# Patient Record
Sex: Male | Born: 1995 | Race: White | Hispanic: No | Marital: Single | Smoking: Never smoker
Health system: Southern US, Community
[De-identification: ages and names within clinical notes are randomized; demographics above are authoritative.]

## PROBLEM LIST (undated history)

## (undated) DIAGNOSIS — R112 Nausea with vomiting, unspecified: Secondary | ICD-10-CM

## (undated) DIAGNOSIS — E669 Obesity, unspecified: Secondary | ICD-10-CM

## (undated) DIAGNOSIS — J302 Other seasonal allergic rhinitis: Secondary | ICD-10-CM

## (undated) DIAGNOSIS — Z9889 Other specified postprocedural states: Secondary | ICD-10-CM

## (undated) DIAGNOSIS — I10 Essential (primary) hypertension: Secondary | ICD-10-CM

## (undated) HISTORY — DX: Other seasonal allergic rhinitis: J30.2

## (undated) HISTORY — DX: Obesity, unspecified: E66.9

## (undated) HISTORY — DX: Essential (primary) hypertension: I10

## (undated) HISTORY — PX: APPENDECTOMY: SHX54

---
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
## 2015-05-13 ENCOUNTER — Encounter: Payer: Self-pay | Admitting: Family Medicine

## 2015-05-13 ENCOUNTER — Ambulatory Visit (INDEPENDENT_AMBULATORY_CARE_PROVIDER_SITE_OTHER): Payer: BLUE CROSS/BLUE SHIELD | Admitting: Family Medicine

## 2015-05-13 VITALS — BP 138/92 | Temp 98.9°F | Ht 76.0 in | Wt 385.0 lb

## 2015-05-13 DIAGNOSIS — J351 Hypertrophy of tonsils: Secondary | ICD-10-CM

## 2015-05-13 DIAGNOSIS — J019 Acute sinusitis, unspecified: Secondary | ICD-10-CM | POA: Diagnosis not present

## 2015-05-13 DIAGNOSIS — B9689 Other specified bacterial agents as the cause of diseases classified elsewhere: Secondary | ICD-10-CM

## 2015-05-13 DIAGNOSIS — E669 Obesity, unspecified: Secondary | ICD-10-CM | POA: Diagnosis not present

## 2015-05-13 MED ORDER — AMOXICILLIN 500 MG PO TABS: 500.0000 mg | ORAL_TABLET | Freq: Three times a day (TID) | ORAL | Status: DC

## 2015-05-13 NOTE — Progress Notes (Signed)
   Subjective:    Patient ID: Vincent Solis, male    DOB: 06-24-1995, 20 y.o.   MRN: [id number]  Cough This is a new problem. Episode onset: one and a half weeks. Associated symptoms include nasal congestion, rhinorrhea and a sore throat. Pertinent negatives include no chest pain, ear pain, fever or wheezing. Treatments tried: mucinex, allergy med.    Progressive head congestion sinus pressure drainage and coughing over the past week and half worse over the past few days denies high fever chills or sweats He is curing toward having gastric bypass surgery somewhere within the next 60 days  Review of Systems  Constitutional: Negative for fever and activity change.  HENT: Positive for congestion, rhinorrhea and sore throat. Negative for ear pain.   Eyes: Negative for discharge.  Respiratory: Positive for cough. Negative for wheezing.   Cardiovascular: Negative for chest pain.       Objective:   Physical Exam  Constitutional: He appears well-developed.  HENT:  Head: Normocephalic.  Mouth/Throat: Oropharynx is clear and moist. No oropharyngeal exudate.  Neck: Normal range of motion.  Cardiovascular: Normal rate, regular rhythm and normal heart sounds.   No murmur heard. Pulmonary/Chest: Effort normal and breath sounds normal. He has no wheezes.  Lymphadenopathy:    He has no cervical adenopathy.  Neurological: He exhibits normal muscle tone.  Skin: Skin is warm and dry.  Nursing note and vitals reviewed.         Assessment & Plan:  Patient was seen today for upper respiratory illness. It is felt that the patient is dealing with sinusitis. Antibiotics were prescribed today. Importance of compliance with medication was discussed. Symptoms should gradually resolve over the course of the next several days. If high fevers, progressive illness, difficulty breathing, worsening condition or failure for symptoms to improve over the next several days then the patient is to follow-up. If any  emergent conditions the patient is to follow-up in the emergency department otherwise to follow-up in the office.   Mild tonsillar hypertrophy if this persists consider removal especially with snoring issues Morbid obesity getting gastric starches a good idea

## 2015-05-27 ENCOUNTER — Telehealth: Payer: Self-pay | Admitting: Family Medicine

## 2015-05-27 NOTE — Telephone Encounter (Signed)
In your yellow folder in your office is the note telling you what Enis needs  To get his gastric bypass. Letter of medical necessity    Father spoke with you about this at his own appt

## 2015-05-29 ENCOUNTER — Ambulatory Visit (INDEPENDENT_AMBULATORY_CARE_PROVIDER_SITE_OTHER): Payer: BLUE CROSS/BLUE SHIELD | Admitting: Family Medicine

## 2015-05-29 ENCOUNTER — Other Ambulatory Visit (HOSPITAL_COMMUNITY): Payer: Self-pay | Admitting: Surgery

## 2015-05-29 ENCOUNTER — Encounter: Payer: Self-pay | Admitting: Family Medicine

## 2015-05-29 VITALS — BP 130/86 | Temp 98.6°F | Ht 76.0 in | Wt 383.5 lb

## 2015-05-29 DIAGNOSIS — J019 Acute sinusitis, unspecified: Secondary | ICD-10-CM | POA: Diagnosis not present

## 2015-05-29 DIAGNOSIS — E669 Obesity, unspecified: Secondary | ICD-10-CM | POA: Diagnosis not present

## 2015-05-29 DIAGNOSIS — B9689 Other specified bacterial agents as the cause of diseases classified elsewhere: Secondary | ICD-10-CM

## 2015-05-29 MED ORDER — AMOXICILLIN-POT CLAVULANATE 875-125 MG PO TABS
1.0000 | ORAL_TABLET | Freq: Two times a day (BID) | ORAL | Status: DC
Start: 2015-05-29 — End: 2015-06-16

## 2015-05-29 NOTE — Progress Notes (Signed)
   Subjective:    Patient ID: Vincent Solis, male    DOB: [birth year], 20 y.o.   MRN: [id number]  Cough This is a new problem. The current episode started 1 to 4 weeks ago. The problem occurs every few minutes. The cough is non-productive. Associated symptoms include rhinorrhea. Pertinent negatives include no chest pain, ear pain, fever or wheezing. Associated symptoms comments: Congestion . Treatments tried: Amoxicillin.   Patient states no other concerns this visit.   Review of Systems  Constitutional: Negative for fever and activity change.  HENT: Positive for congestion and rhinorrhea. Negative for ear pain.   Eyes: Negative for discharge.  Respiratory: Positive for cough. Negative for wheezing.   Cardiovascular: Negative for chest pain.       Objective:   Physical Exam  Constitutional: He appears well-developed.  HENT:  Head: Normocephalic.  Mouth/Throat: Oropharynx is clear and moist. No oropharyngeal exudate.  Neck: Normal range of motion.  Cardiovascular: Normal rate, regular rhythm and normal heart sounds.   No murmur heard. Pulmonary/Chest: Effort normal and breath sounds normal. He has no wheezes.  Lymphadenopathy:    He has no cervical adenopathy.  Neurological: He exhibits normal muscle tone.  Skin: Skin is warm and dry.  Nursing note and vitals reviewed.         Assessment & Plan:  Persistent upper respiratory illness probable mild sinusitis and additional round of antibiotics prescribed if problems with the medicine notify us otherwise follow-up if any issues down the road warning signs discussed. Patient was seen today for upper respiratory illness. It is felt that the patient is dealing with sinusitis. Antibiotics were prescribed today. Importance of compliance with medication was discussed. Symptoms should gradually resolve over the course of the next several days. If high fevers, progressive illness, difficulty breathing, worsening condition or failure for  symptoms to improve over the next several days then the patient is to follow-up. If any emergent conditions the patient is to follow-up in the emergency department otherwise to follow-up in the office.

## 2015-06-02 ENCOUNTER — Ambulatory Visit (HOSPITAL_BASED_OUTPATIENT_CLINIC_OR_DEPARTMENT_OTHER): Payer: BLUE CROSS/BLUE SHIELD | Attending: Surgery | Admitting: Radiology

## 2015-06-02 VITALS — Ht 76.0 in | Wt 385.0 lb

## 2015-06-02 DIAGNOSIS — G4733 Obstructive sleep apnea (adult) (pediatric): Secondary | ICD-10-CM | POA: Insufficient documentation

## 2015-06-02 DIAGNOSIS — G4736 Sleep related hypoventilation in conditions classified elsewhere: Secondary | ICD-10-CM | POA: Insufficient documentation

## 2015-06-02 DIAGNOSIS — Z6841 Body Mass Index (BMI) 40.0 and over, adult: Secondary | ICD-10-CM | POA: Insufficient documentation

## 2015-06-02 DIAGNOSIS — E669 Obesity, unspecified: Secondary | ICD-10-CM | POA: Insufficient documentation

## 2015-06-02 DIAGNOSIS — G4700 Insomnia, unspecified: Secondary | ICD-10-CM | POA: Diagnosis not present

## 2015-06-02 DIAGNOSIS — G4737 Central sleep apnea in conditions classified elsewhere: Secondary | ICD-10-CM | POA: Diagnosis not present

## 2015-06-02 DIAGNOSIS — G4719 Other hypersomnia: Secondary | ICD-10-CM | POA: Insufficient documentation

## 2015-06-02 DIAGNOSIS — R0683 Snoring: Secondary | ICD-10-CM | POA: Insufficient documentation

## 2015-06-09 NOTE — Telephone Encounter (Signed)
Father notified and transferred to front desk to schedule appointment.

## 2015-06-09 NOTE — Telephone Encounter (Signed)
Contact father, had wanted [place] to do a letter documenting mneed for bariatric interventionwe have not sen this pt for a full exam in several yrs in order to write a strong letter to back up need for bariatric procedure would rec wellness exam and evaluation and discusiion then we can do letter

## 2015-06-13 NOTE — Progress Notes (Signed)
   Patient Name: Vincent Solis, Vincent Solis Date: 06/02/2015 Gender: Male D.O.B: [birth year] Age (years): 19 Referring Provider: Luretha Murphy Height (inches): 76 Interpreting Physician: Jetty Duhamel MD, ABSM Weight (lbs): 385 RPSGT: Ulyess Mort BMI: 47 MRN: [id number] Neck Size: 21.00  CLINICAL INFORMATION Sleep Study Type: NPSG Indication for sleep study: Excessive Daytime Sleepiness, Obesity, Snoring Epworth Sleepiness Score: 4  SLEEP STUDY TECHNIQUE As per the AASM Manual for the Scoring of Sleep and Associated Events v2.3 (April 2016) with a hypopnea requiring 4% desaturations. The channels recorded and monitored were frontal, central and occipital EEG, electrooculogram (EOG), submentalis EMG (chin), nasal and oral airflow, thoracic and abdominal wall motion, anterior tibialis EMG, snore microphone, electrocardiogram, and pulse oximetry.  MEDICATIONS Patient's medications include: charted fpr review. Medications self-administered by patient during sleep study : No sleep medicine administered.  SLEEP ARCHITECTURE The study was initiated at 9:59:28 PM and ended at 4:18:25 AM. Sleep onset time was 14.3 minutes and the sleep efficiency was 42.7%. The total sleep time was 162.0 minutes. Stage REM latency was N/A minutes. The patient spent 46.91% of the night in stage N1 sleep, 41.67% in stage N2 sleep, 11.42% in stage N3 and 0.00% in REM. Alpha intrusion was absent. Supine sleep was 87.04%. Wake after sleep onset 202.6 minutes with fragmentation and frequent arousal and awakenings  RESPIRATORY PARAMETERS The overall apnea/hypopnea index (AHI) was 53.7 per hour. There were 49 total apneas, including 4 obstructive, 43 central and 2 mixed apneas. There were 96 hypopneas and 49 RERAs. The AHI during Stage REM sleep was N/A per hour. AHI while supine was 61.7 per hour. The mean oxygen saturation was 91.79%. The minimum SpO2 during sleep was 79.00%. Moderate snoring was noted during  this study.  CARDIAC DATA The 2 lead EKG demonstrated sinus rhythm. The mean heart rate was 59.22 beats per minute. Other EKG findings include: None.  LEG MOVEMENT DATA The total PLMS were 15 with a resulting PLMS index of 5.56. Associated arousal with leg movement index was 1.9 .  IMPRESSIONS - Severe obstructive sleep apnea occurred during this study (AHI = 53.7/h). - Significant difficulty initiating andf maintaining sleep. - Insufficient sleep and early events to meet protocol requirements for split CPAP titiration - Mild central sleep apnea occurred during this study (CAI = 15.9/h). - Moderate oxygen desaturation was noted during this study (Min O2 = 79.00%). - The patient snored with Moderate snoring volume. - No cardiac abnormalities were noted during this study. - Mild periodic limb movements of sleep occurred during the study. No significant associated arousals.  DIAGNOSIS - Obstructive Sleep Apnea (327.23 [G47.33 ICD-10]) - Central Sleep Apnea (327.27 [G47.37 ICD-10]) - Nocturnal Hypoxemia (327.26 [G47.36 ICD-10]) - Difficulty initiating and maintaining sleep- insomnia on this study night  RECOMMENDATIONS - CPAP titration to determine optimal pressure required to alleviate sleep disordered breathing. - Sleep medication may help with anemia, especially during initial CPAP at home.  - Positional therapy avoiding supine position during sleep. - Avoid alcohol, sedatives and other CNS depressants that may worsen sleep apnea and disrupt normal sleep architecture. - Sleep hygiene should be reviewed to assess factors that may improve sleep quality. - Weight management and regular exercise should be initiated or continued if appropriate.  Waymon Budge Diplomate, American Board of Sleep Medicine  ELECTRONICALLY SIGNED ON:  06/13/2015, 9:50 AM [facility] PH: [phone]   FX: [phone] ACCREDITED BY THE AMERICAN ACADEMY OF SLEEP MEDICINE

## 2015-06-16 ENCOUNTER — Ambulatory Visit (INDEPENDENT_AMBULATORY_CARE_PROVIDER_SITE_OTHER): Payer: BLUE CROSS/BLUE SHIELD | Admitting: Family Medicine

## 2015-06-16 ENCOUNTER — Encounter: Payer: Self-pay | Admitting: Family Medicine

## 2015-06-16 VITALS — BP 130/80 | Ht 76.0 in | Wt 390.4 lb

## 2015-06-16 DIAGNOSIS — I10 Essential (primary) hypertension: Secondary | ICD-10-CM | POA: Diagnosis not present

## 2015-06-16 DIAGNOSIS — Z23 Encounter for immunization: Secondary | ICD-10-CM | POA: Diagnosis not present

## 2015-06-16 DIAGNOSIS — Z0000 Encounter for general adult medical examination without abnormal findings: Secondary | ICD-10-CM | POA: Diagnosis not present

## 2015-06-16 NOTE — Progress Notes (Signed)
   Subjective:    Patient ID: Vincent Solis, male    DOB: 1996/02/13, 20 y.o.   MRN: [id number]  HPI The patient comes in today for a wellness visit.    A review of their health history was completed.  A review of medications was also completed.  Any needed refills; no  Eating habits: working on healthy eating-sees dietician Friday  Falls/  MVA accidents in past few months: no  Regular exercise: walking and work  Barrister's clerk pt sees on regular basis: preparing for gastric sleeve procedure the end of feb  Preventative health issues were discussed.   Additional concerns: none  Participated in [place] craig a couple times, weight watchrs a couple times, lost weight with but always regained  Some tendency to gain weight on mom's side and slow metabolism  BP meds  Review of Systems  Constitutional: Negative for fever, activity change and appetite change.  HENT: Negative for congestion and rhinorrhea.   Eyes: Negative for discharge.  Respiratory: Negative for cough and wheezing.   Cardiovascular: Negative for chest pain.  Gastrointestinal: Negative for vomiting, abdominal pain and blood in stool.  Genitourinary: Negative for frequency and difficulty urinating.  Musculoskeletal: Negative for neck pain.  Skin: Negative for rash.  Allergic/Immunologic: Negative for environmental allergies and food allergies.  Neurological: Negative for weakness and headaches.  Psychiatric/Behavioral: Negative for agitation.  All other systems reviewed and are negative.      Objective:   Physical Exam  Constitutional: He appears well-developed and well-nourished.  Morbid obesity present BMI greater than 50  HENT:  Head: Normocephalic and atraumatic.  Right Ear: External ear normal.  Left Ear: External ear normal.  Nose: Nose normal.  Mouth/Throat: Oropharynx is clear and moist.  Eyes: EOM are normal. Pupils are equal, round, and reactive to light.  Neck: Normal range of motion. Neck  supple. No thyromegaly present.  Cardiovascular: Normal rate, regular rhythm and normal heart sounds.   No murmur heard. Pulmonary/Chest: Effort normal and breath sounds normal. No respiratory distress. He has no wheezes.  Abdominal: Soft. Bowel sounds are normal. He exhibits no distension and no mass. There is no tenderness.  Genitourinary: Penis normal.  Musculoskeletal: Normal range of motion. He exhibits no edema.  Lymphadenopathy:    He has no cervical adenopathy.  Neurological: He is alert. He exhibits normal muscle tone.  Skin: Skin is warm and dry. No erythema.  Psychiatric: He has a normal mood and affect. His behavior is normal. Judgment normal.  Vitals reviewed.         Assessment & Plan:  Impression wellness exam #2 morbid obesity discussed at great length. Patient is been through multiple attempts at weight loss to solid tries with weight watchers. 2 solid tries with [name]. Another area check weight loss clinic. All have eventually failed. Patient continues to gain weight. Now weighs more than he ever has. Blood work in the past is revealed insulin resistance. He notes substantial dyspnea with exertion. Starting to have joint pain knees hips after exercise patient is looking towards bariatric intervention and I agree 110% rationale discussed #3 essential hypertension no need for meds at this time plan we will do N support we'll fill out forms etc. no need to do blood work now only a month prior to procedure. Of note patient also has WSL

## 2015-06-19 ENCOUNTER — Encounter: Payer: Self-pay | Admitting: Dietician

## 2015-06-19 ENCOUNTER — Encounter: Payer: BLUE CROSS/BLUE SHIELD | Attending: Surgery | Admitting: Dietician

## 2015-06-19 DIAGNOSIS — Z6841 Body Mass Index (BMI) 40.0 and over, adult: Secondary | ICD-10-CM | POA: Insufficient documentation

## 2015-06-19 NOTE — Patient Instructions (Signed)
Follow Pre-Op Goals Try Protein Shakes Call [facility] at [phone] when surgery is scheduled to enroll in Pre-Op Class  Things to remember:  Please always be honest with us. We want to support you!  If you have any questions or concerns in between appointments, please call or email Liz, Lincon Sahlin, or Laurie.  The diet after surgery will be high protein and low in carbohydrate.  Vitamins and calcium need to be taken for the rest of your life.  Feel free to include support people in any classes or appointments.   Supplement recommendations:  Complete" Multivitamin: Sleeve Gastrectomy and RYGB patients take a double dose of MVI. LAGB patients take single dose as it is written on the package. Vitamin must be liquid or chewable but not gummy. Examples of these include Flintstones Complete and Centrum Complete. If the vitamin is bariatric-specific, take 1 dose as it is already formulated for bariatric surgery patients. Examples of these are Bariatric Advantage, Celebrate, and Wellesse. These can be found at the [facility] and/or online.     Calcium citrate: 1500 mg/day of Calcium citrate (also chewable or liquid) is recommended for all procedures. The body is only able to absorb 500-600 mg of Calcium at one time so 3 daily doses of 500 mg are recommended. Calcium doses must be taken a minimum of 2 hours apart. Additionally, Calcium must be taken 2 hours apart from iron-containing MVI. Examples of brands include Celebrate, Bariatric Advantage, and Wellesse. These brands must be purchased online or at the [facility]. Citracal Petites is the only Calcium citrate supplement found in general grocery stores and pharmacies. This is in tablet form and may be recommended for patients who do not tolerate chewable Calcium.  Continued or added Vitamin D supplementation based on individual needs.    Vitamin B12: 300-500 mcg/day for Sleeve Gastrectomy and RYGB. Optional for  LAGB patients as stomach remains fully intact. Must be taken intramuscularly, sublingually, or inhaled nasally. Oral route is not recommended. 

## 2015-06-19 NOTE — Progress Notes (Signed)
  Pre-Op Assessment Visit:  Pre-Operative Sleeve gastrectomy Surgery  Medical Nutrition Therapy:  Appt start time: 1000   End time:  1030.  Patient was seen on 06/19/2015 for Pre-Operative Nutrition Assessment. Assessment and letter of approval faxed to [facility] Bariatric Surgery Program coordinator on 06/19/2015.   Preferred Learning Style:   No preference indicated   Learning Readiness:   Ready  Handouts given during visit include:  Pre-Op Goals Bariatric Surgery Protein Shakes   During the appointment today the following Pre-Op Goals were reviewed with the patient: Maintain or lose weight as instructed by your surgeon Make healthy food choices Begin to limit portion sizes Limited concentrated sugars and fried foods Keep fat/sugar in the single digits per serving on   food labels Practice CHEWING your food  (aim for 30 chews per bite or until applesauce consistency) Practice not drinking 15 minutes before, during, and 30 minutes after each meal/snack Avoid all carbonated beverages  Avoid/limit caffeinated beverages  Avoid all sugar-sweetened beverages Consume 3 meals per day; eat every 3-5 hours Make a list of non-food related activities Aim for 64-100 ounces of FLUID daily  Aim for at least 60-80 grams of PROTEIN daily Look for a liquid protein source that contain ?15 g protein and ?5 g carbohydrate  (ex: shakes, drinks, shots)  Demonstrated degree of understanding via:  Teach Back  Teaching Method Utilized:  Visual Auditory Hands on  Barriers to learning/adherence to lifestyle change: none  [facility] to call the [facility] to enroll in Pre-Op and Post-Op Nutrition Education when surgery date is scheduled.

## 2015-06-25 ENCOUNTER — Other Ambulatory Visit: Payer: Self-pay

## 2015-06-25 ENCOUNTER — Ambulatory Visit (HOSPITAL_COMMUNITY)
Admission: RE | Admit: 2015-06-25 | Discharge: 2015-06-25 | Disposition: A | Discharge status: Home / Self Care | Payer: BLUE CROSS/BLUE SHIELD | Source: Ambulatory Visit | Attending: Surgery | Admitting: Surgery

## 2015-06-25 DIAGNOSIS — Z01810 Encounter for preprocedural cardiovascular examination: Secondary | ICD-10-CM | POA: Diagnosis not present

## 2015-06-25 DIAGNOSIS — Z01818 Encounter for other preprocedural examination: Secondary | ICD-10-CM | POA: Diagnosis not present

## 2015-06-25 DIAGNOSIS — K224 Dyskinesia of esophagus: Secondary | ICD-10-CM | POA: Diagnosis not present

## 2015-06-25 IMAGING — CR DG UGI W/ KUB
14 of 16 series · 14 of 17 positions shown · non-contrast
Comparison: 01/13/2005 CT abdomen/ pelvis.

CLINICAL DATA: 19-year-old male presents for preoperative
evaluation for gastric sleeve surgery

EXAM:
UPPER GI SERIES WITH KUB
TECHNIQUE: After obtaining a scout radiograph a routine upper GI series was
performed using thin barium.
FLUOROSCOPY TIME:  Fluoroscopy Time (in minutes and seconds): 2
minutes 0 seconds.
Number of Acquired Images:  3.

[t abdomen supine]
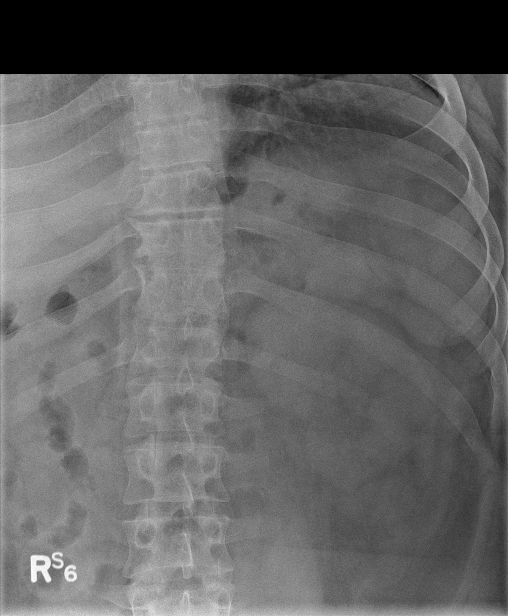

[fluoro_barium 2fps_bw (1 of 4)]
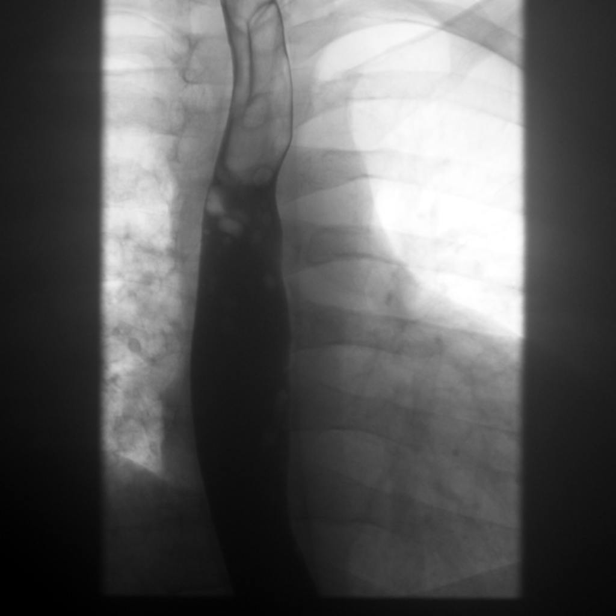

[fluoro_barium 2fps_bw (2 of 4)]
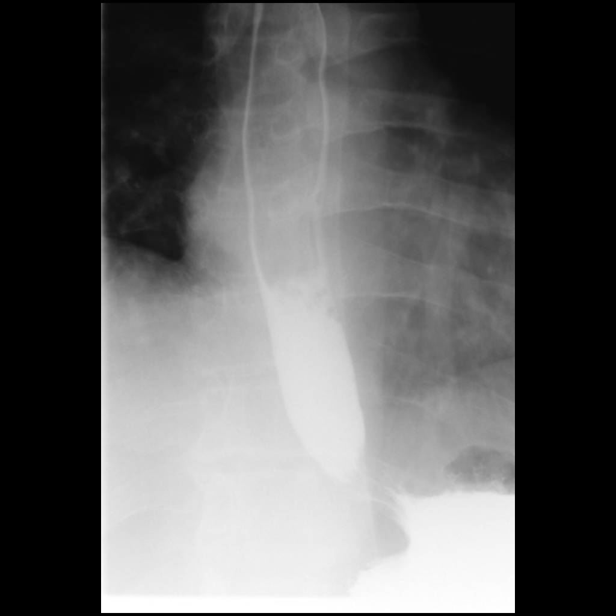

[fluoro_barium 2fps_bw (3 of 4)]
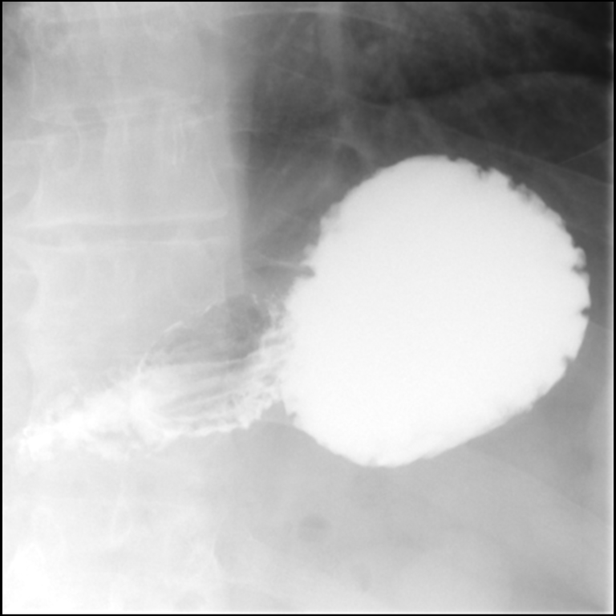

[fluoro_barium 2fps_bw (4 of 4)]
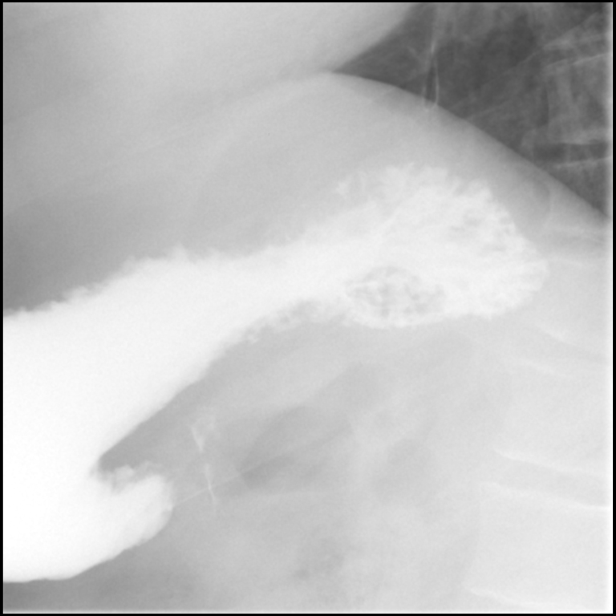

[cp_standard (1 of 9)]
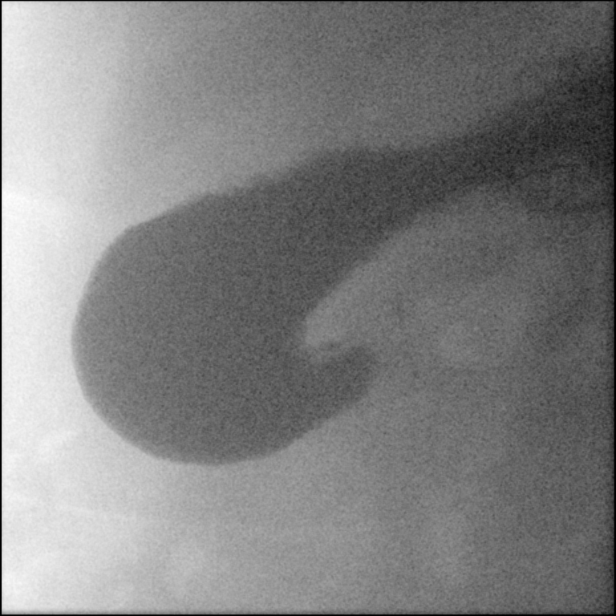

[cp_standard (2 of 9)]
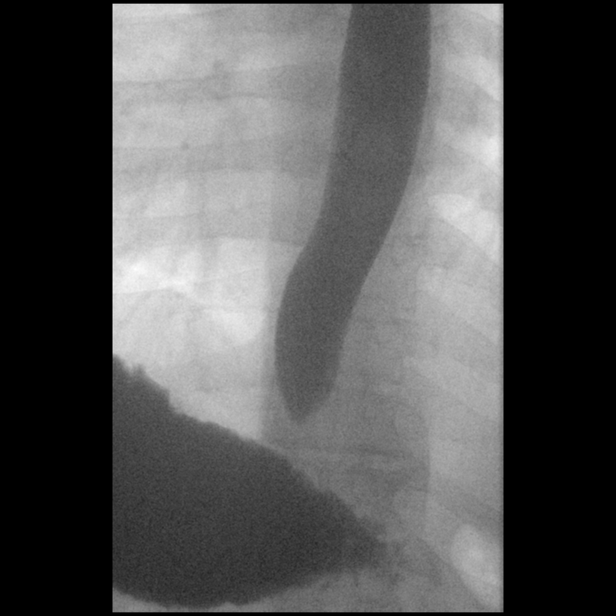

[cp_standard (3 of 9)]
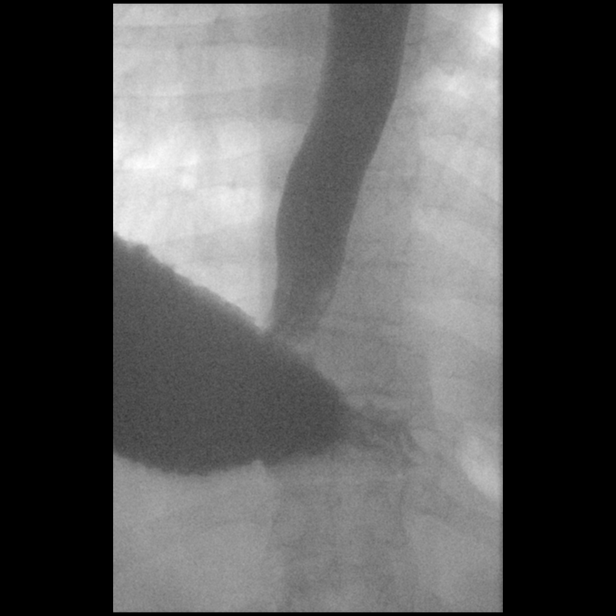

[cp_standard (4 of 9)]
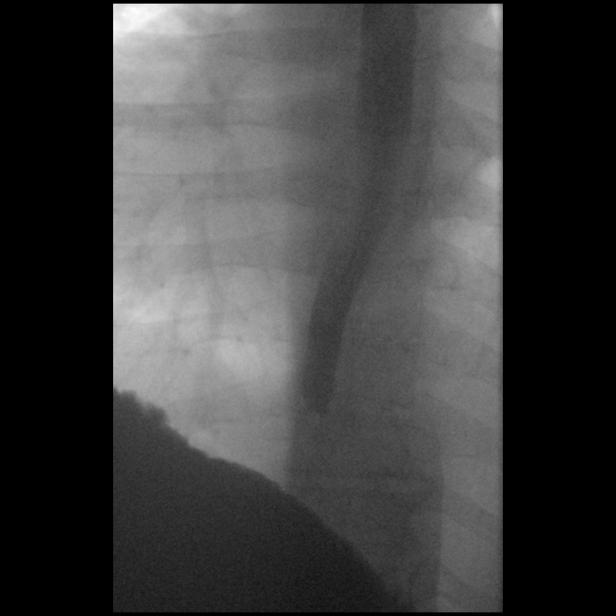

[cp_standard (5 of 9)]
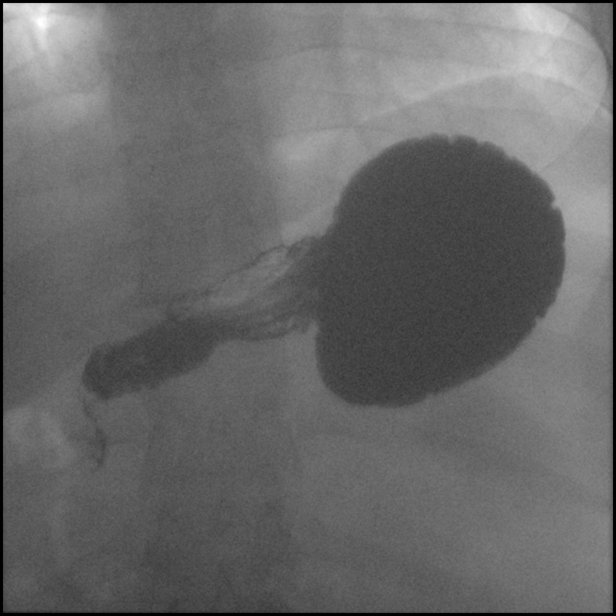

[cp_standard (6 of 9)]
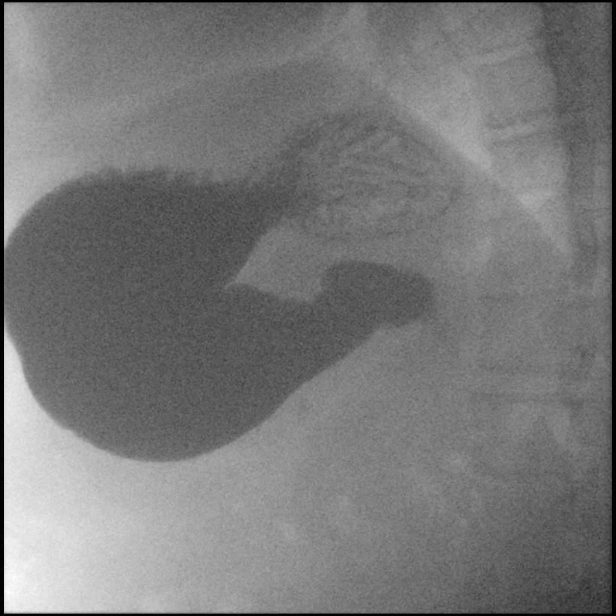

[cp_standard (7 of 9)]
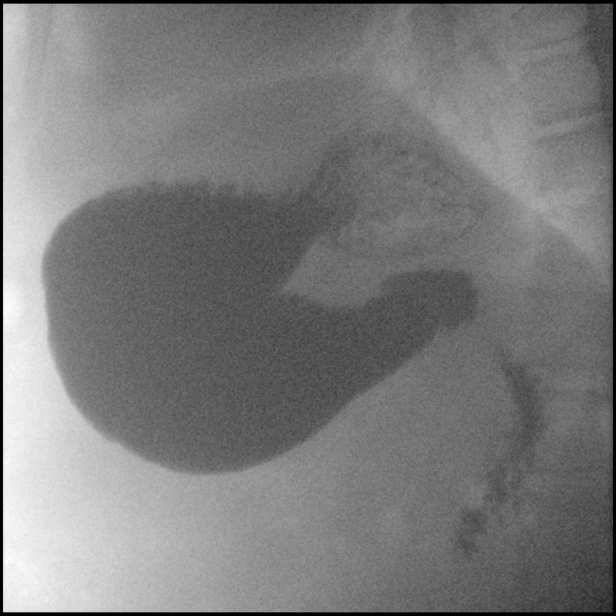

[cp_standard (8 of 9)]
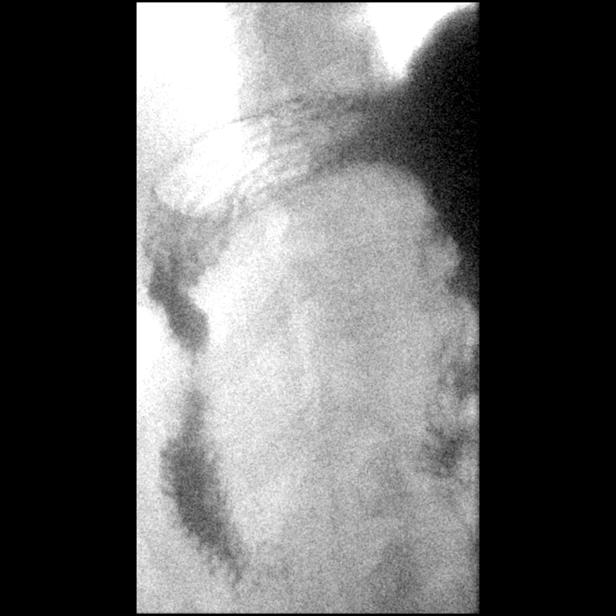

[cp_standard (9 of 9)]
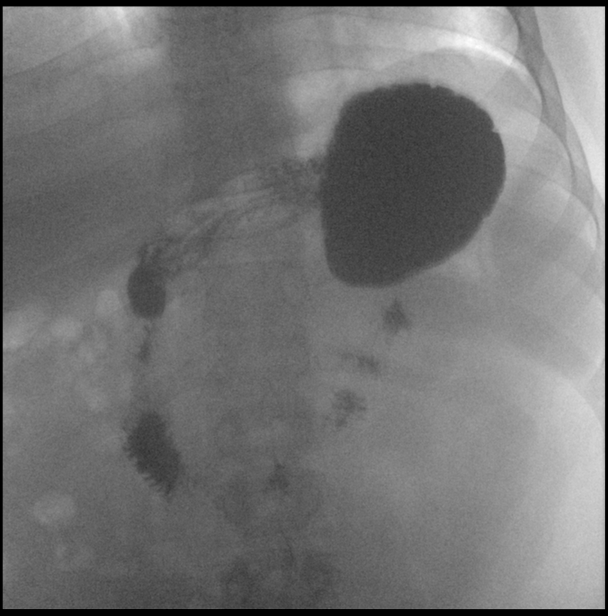

[14 of 17 positions shown; findings below may reference images not displayed]

FINDINGS: Scout radiograph demonstrates a nonobstructive bowel gas pattern,
with minimal physiologic colonic stool and no evidence of
pneumatosis, pneumoperitoneum or pathologic soft tissue
calcifications.

There is mild esophageal dysmotility, characterized by mild
intermittent weakening of primary peristalsis in the mid to lower
thoracic esophagus. There is no hiatal hernia. No gastroesophageal
reflux was elicited despite water siphon test. Esophageal
distensibility is normal. No esophageal mass, ulcer or stricture are
detected. A mildly distended stomach appears grossly normal, with no
evidence of gastric fold thickening, filling defect, stricture or
ulcer. The duodenum is normal caliber, with no evidence of duodenal
fold thickening, filling defect, stricture or ulcer.
IMPRESSION: 1. Mild esophageal dysmotility, with a pattern suggestive of reflux
related dysmotility.
2. Otherwise normal single contrast upper GI. No hiatal hernia. No
gastroesophageal reflux elicited.

## 2015-06-25 IMAGING — CR DG CHEST 2V
2 series · 2 of 2 positions shown · non-contrast
Comparison: None.

CLINICAL DATA: Preoperative bariatric surgery for obesity

EXAM:
CHEST  2 VIEW

[w chest pa]
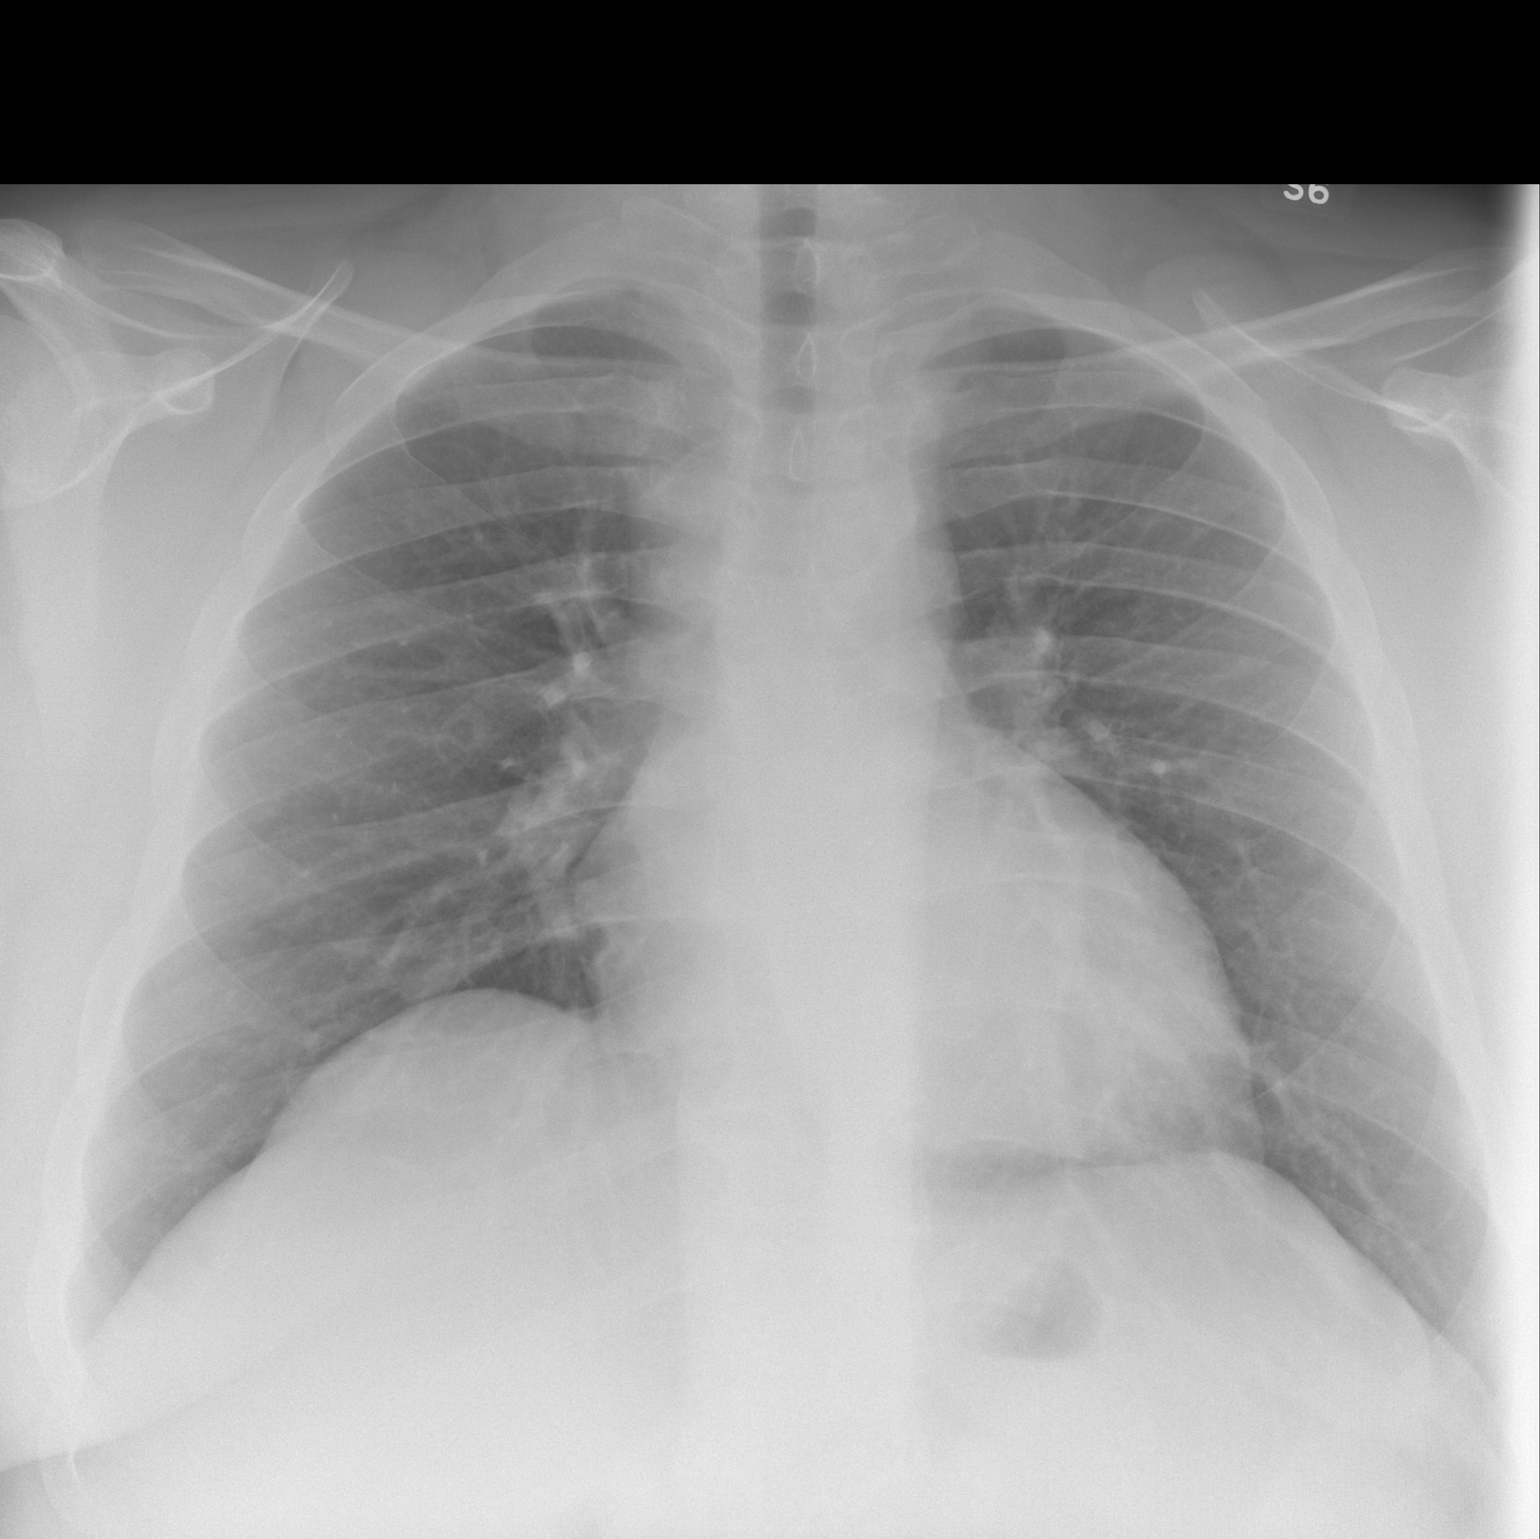

[w chest lat]
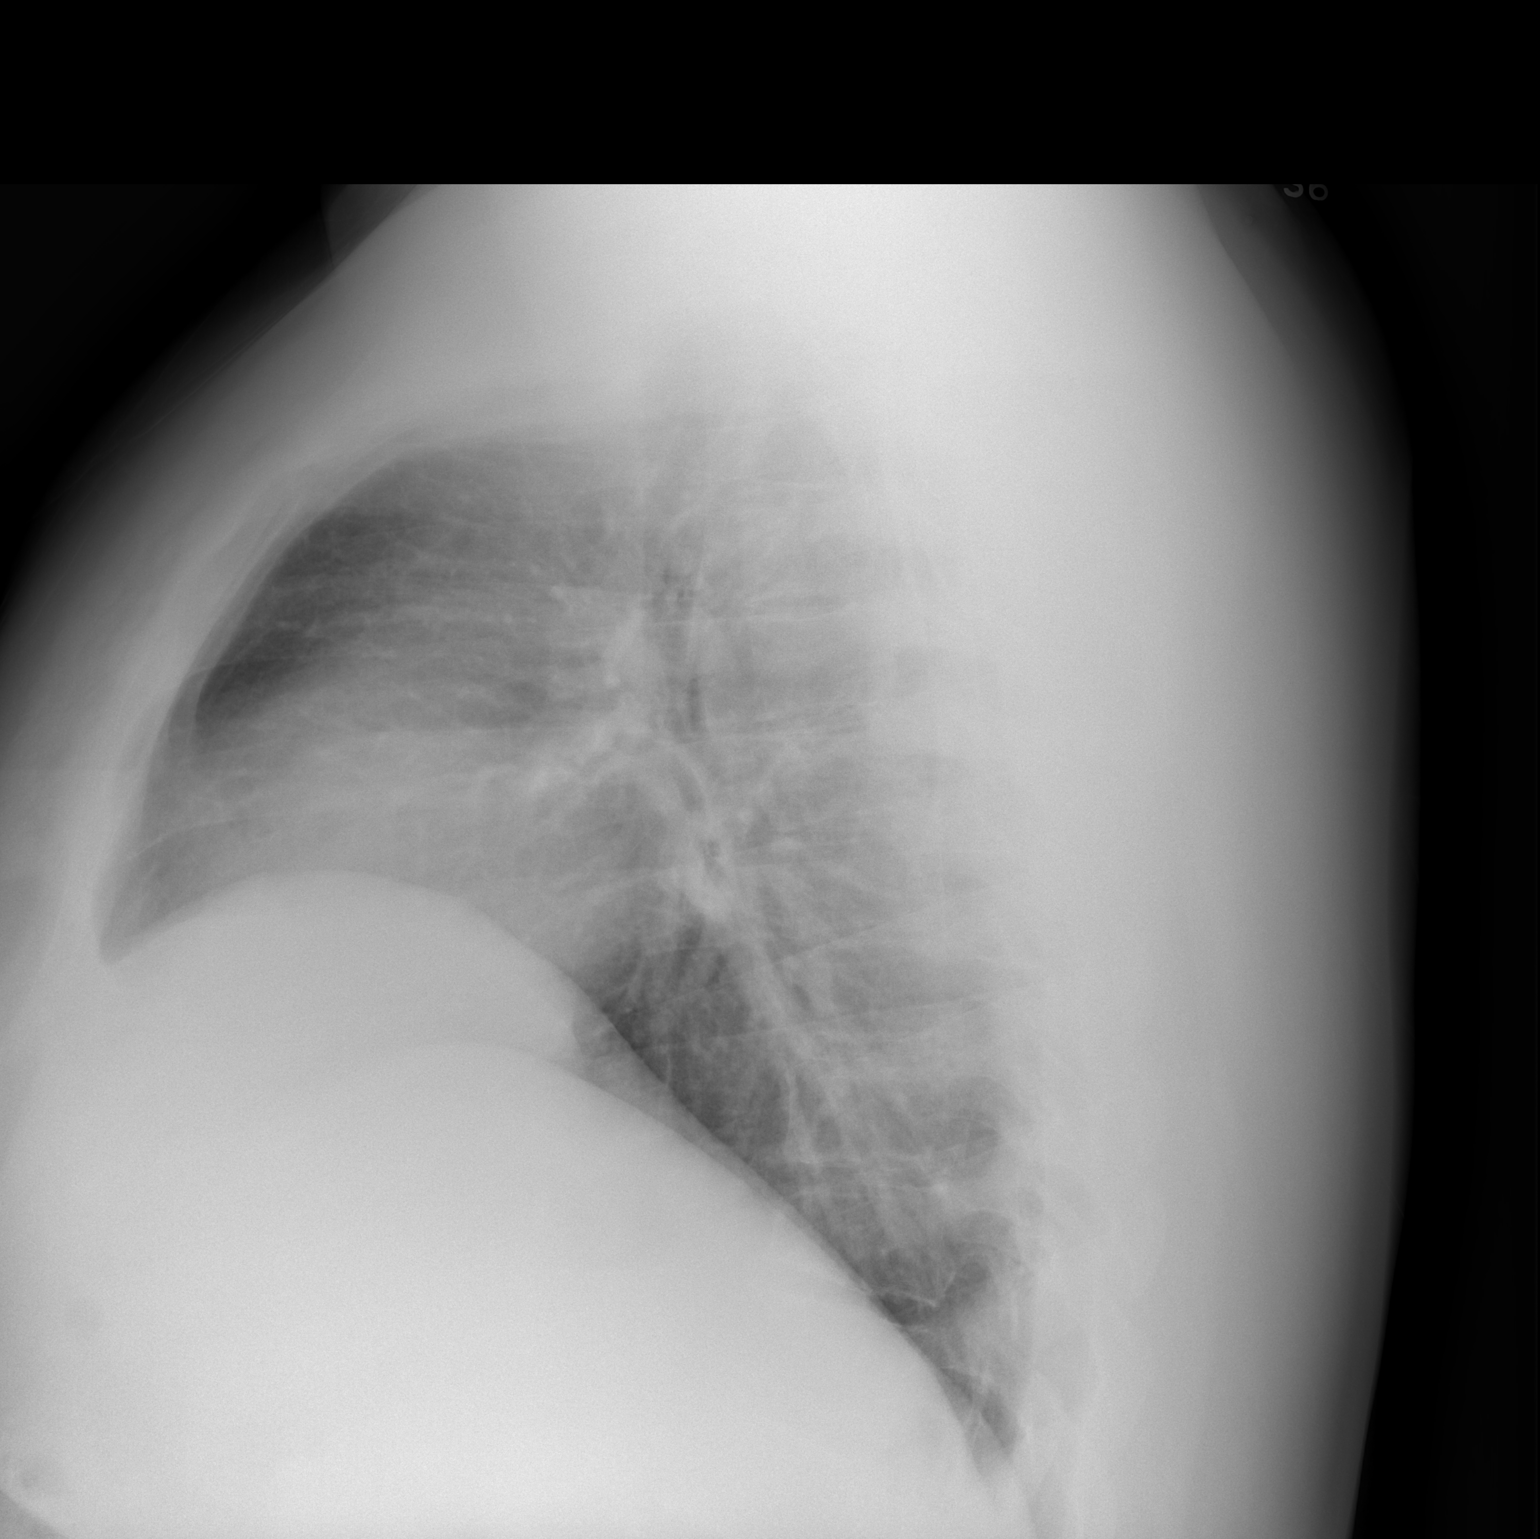

[2 of 2 positions shown; findings below may reference images not displayed]

FINDINGS: Lungs are clear. Heart size and pulmonary vascularity are normal. No
adenopathy. No bone lesions.
IMPRESSION: No abnormality noted.

## 2015-06-28 ENCOUNTER — Encounter: Payer: Self-pay | Admitting: Family Medicine

## 2015-07-01 ENCOUNTER — Encounter: Payer: Self-pay | Admitting: Family Medicine

## 2015-07-01 NOTE — Telephone Encounter (Signed)
Erica sent form to Dr. Daphine Deutscher and also left  copy ready to pickup at front window.

## 2015-07-05 ENCOUNTER — Ambulatory Visit (HOSPITAL_BASED_OUTPATIENT_CLINIC_OR_DEPARTMENT_OTHER): Payer: Self-pay

## 2015-07-20 ENCOUNTER — Encounter: Payer: BLUE CROSS/BLUE SHIELD | Attending: Surgery

## 2015-07-20 ENCOUNTER — Ambulatory Visit: Payer: Self-pay | Admitting: Dietician

## 2015-07-20 DIAGNOSIS — Z6841 Body Mass Index (BMI) 40.0 and over, adult: Secondary | ICD-10-CM | POA: Insufficient documentation

## 2015-07-21 ENCOUNTER — Ambulatory Visit: Payer: Self-pay | Admitting: Surgery

## 2015-07-21 NOTE — Progress Notes (Signed)
  Pre-Operative Nutrition Class:  Appt start time: 1610   End time:  1830.  Patient was seen on 07/20/2015 for Pre-Operative Bariatric Surgery Education at the [facility].   Surgery date: 08/04/2015 Surgery type: Sleeve Gastrectomy [facility] weight at [facility]: 394 lbs on 06/19/2015 Weight today: 395.3 lbs  TANITA  BODY COMP RESULTS  07/20/15   BMI (kg/m^2) N/A   Fat Mass (lbs)    Fat Free Mass (lbs)    Total Body Water (lbs)    Samples given per MNT protocol. Patient educated on appropriate usage: Premier protein shake (chocolate - qty 1) Lot #: [id number] Exp: 11/2015  Celebrate Calcium Citrate chew (caramel - qty 1) Lot #: [id number] Exp: 07/2016  Celebrate Vitamins Multivitamin (pineapple strawberry - qty 1) Lot #: [id number] Exp: 04/2016  PB2 (qty 1) Lot #: [id number] Exp: 02/2017  Unjury Protein Powder (strawberry - qty 1) Lot #: [id number] Exp: 03/2016  The following the learning objectives were met by the patient during this course:  Identify Pre-Op Dietary Goals and will begin 2 weeks pre-operatively  Identify appropriate sources of fluids and proteins   State protein recommendations and appropriate sources pre and post-operatively  Identify Post-Operative Dietary Goals and will follow for 2 weeks post-operatively  Identify appropriate multivitamin and calcium sources  Describe the need for physical activity post-operatively and will follow MD recommendations  State when to call healthcare provider regarding medication questions or post-operative complications  Handouts given during class include:  Pre-Op Bariatric Surgery Diet Handout  Protein Shake Handout  Post-Op Bariatric Surgery Nutrition Handout  BELT Program Information Flyer  Support Group Information Flyer  [facility] Bariatric Supplements Price List  Follow-Up Plan: Patient will follow-up at [facility] 2 weeks post operatively for diet advancement per MD.

## 2015-07-21 NOTE — H&P (Signed)
Vincent Solis Location: [facility] Patient #: [id number] DOB: 11-Oct-1995 Single / Language: [place] / Race: Undefined Male History of Present Illness Vincent Solis(Tonatiuh Mallon B. Daphine DeutscherMartin MD; 05/26/2015 2:48 PM) The patient is a 20 year old male who presents for a bariatric surgery evaluation. Vincent RossettiRyan comes in to [facility] with his dad to discuss bariatric surgey. He has been to one of our seminars. 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 We discussed roux en y gastric bypass and sleeve gastrectomy. I gave him informed consents for both procedures. He asked good questions. I think that the sleeve may be beneficial to him because of the long term risk of internal hernia with roux and the risk of ulcers with NSAIDs and the bypass.   He does snore. He has tried many different weight loss attempts with limited success. These include Adipex, Optifast, hypnosis, and Doylene BodeJenny Craig.   He has never had DVT. Has had a prior open appendectomy. He has decided that he wants a sleeve gastrectomy.  Seen in the office on 3/14 to complete evaluation and answer questions    Physical Exam Vincent Solis(Gracen Ringwald B. Daphine DeutscherMartin MD; 05/26/2015 2:43 PM) General Mental Status-Alert. Build & Nutrition-Obese(broad shoulders and large frame at 6 3''). Note: HEENT unremarkable Neck supple without masses Chest clear Heart SR without murmurs Abdomen nontender with transverse scar in the right lower quadrant-appendectomy Ext FROM without edema or clubbing Neuro alert and oriented x 3; motor and sensory function are intact     Assessment & Plan Vincent Solis(Amiaya Mcneeley B. Daphine DeutscherMartin MD; 05/26/2015 2:44 PM) OBESITY, MORBID, BMI 40.0-49.9 (E66.01)  For sleeve gastrectomy.   Matt B. Daphine DeutscherMartin, MD, FACS

## 2015-07-24 ENCOUNTER — Telehealth: Payer: Self-pay | Admitting: Family Medicine

## 2015-07-24 NOTE — Telephone Encounter (Signed)
Form for his weight loss surgery

## 2015-07-28 ENCOUNTER — Inpatient Hospital Stay (HOSPITAL_COMMUNITY)
Admission: RE | Admit: 2015-07-28 | Discharge: 2015-07-28 | Disposition: A | Discharge status: Home / Self Care | Payer: BLUE CROSS/BLUE SHIELD | Source: Ambulatory Visit

## 2015-07-28 NOTE — Patient Instructions (Signed)
Vincent Solis  07/28/2015   Your procedure is scheduled on: Tuesday 08/04/2015  [facility]  [place]tr[place]ators to 3rd floor to  Short Stay Center at  0515 AM.  Call this number if you have problems the morning of surgery [phone]   Remember: ONLY 1 PERSON MAY GO WITH YOU TO SHORT STAY TO GET  READY MORNING OF YOUR SURGERY.   Do not eat food or drink liquids :After Midnight.     Take these medicines the morning of surgery with A SIP OF WATER: none                                 You may not have any metal on your body including hair pins and              piercings  Do not wear jewelry, lotions, powders or perfumes, deodorant                         Men may shave face and neck.   Do not bring valuables to the hospital. [place] IS NOT             RESPONSIBLE   FOR VALUABLES.  Contacts, dentures or bridgework may not be worn into surgery.  Leave suitcase in the car. After surgery it may be brought to your room.                 Please read over the following fact sheets you were given: _____________________________________________________________________             [facility] Pre[facility] Before surgery, you can play an important role.  Because skin is not sterile, your skin needs to be as free of germs as possible.  You can reduce the number of germs on your skin by washing with CHG (chlorahexidine gluconate) soap before surgery.  CHG is an antiseptic cleaner which kills germs and bonds with the skin to continue killing germs even after washing. Please DO NOT use if you have an allergy to CHG or antibacterial soaps.  If your skin becomes reddened/irritated stop using the CHG and inform your nurse when you arrive at Short Stay. Do not shave (including legs and underarms) for at least 48 hours prior to the first CHG shower.  You may shave your face/neck. Please follow these instructions carefully:  1.  Shower with  CHG Soap the night before surgery and the  morning of Surgery.  2.  If you choose to wash your hair, wash your hair first as usual with your  normal  shampoo.  3.  After you shampoo, rinse your hair and body thoroughly to remove the  shampoo.                           4.  Use CHG as you would any other liquid soap.  You can apply chg directly  to the skin and wash                       Gently with a scrungie or clean washcloth.  5.  Apply the CHG Soap to your body ONLY FROM THE NECK DOWN.   Do not use on  face/ open                           Wound or open sores. Avoid contact with eyes, ears mouth and genitals (private parts).                       Wash face,  Genitals (private parts) with your normal soap.             6.  Wash thoroughly, paying special attention to the area where your surgery  will be performed.  7.  Thoroughly rinse your body with warm water from the neck down.  8.  DO NOT shower/wash with your normal soap after using and rinsing off  the CHG Soap.                9.  Pat yourself dry with a clean towel.            10.  Wear clean pajamas.            11.  Place clean sheets on your bed the night of your first shower and do not  sleep with pets. Day of Surgery : Do not apply any lotions/deodorants the morning of surgery.  Please wear clean clothes to the hospital/surgery center.  FAILURE TO FOLLOW THESE INSTRUCTIONS MAY RESULT IN THE CANCELLATION OF YOUR SURGERY PATIENT SIGNATURE_________________________________  NURSE SIGNATURE__________________________________  ________________________________________________________________________   Vincent Solis  An incentive spirometer is a tool that can help keep your lungs clear and active. This tool measures how well you are filling your lungs with each breath. Taking long deep breaths may help reverse or decrease the chance of developing breathing (pulmonary) problems (especially infection) following:  A long period of  time when you are unable to move or be active. BEFORE THE PROCEDURE   If the spirometer includes an indicator to show your best effort, your nurse or respiratory therapist will set it to a desired goal.  If possible, sit up straight or lean slightly forward. Try not to slouch.  Hold the incentive spirometer in an upright position. INSTRUCTIONS FOR USE  1. Sit on the edge of your bed if possible, or sit up as far as you can in bed or on a chair. 2. Hold the incentive spirometer in an upright position. 3. Breathe out normally. 4. Place the mouthpiece in your mouth and seal your lips tightly around it. 5. Breathe in slowly and as deeply as possible, raising the piston or the ball toward the top of the column. 6. Hold your breath for 3-5 seconds or for as long as possible. Allow the piston or ball to fall to the bottom of the column. 7. Remove the mouthpiece from your mouth and breathe out normally. 8. Rest for a few seconds and repeat Steps 1 through 7 at least 10 times every 1-2 hours when you are awake. Take your time and take a few normal breaths between deep breaths. 9. The spirometer may include an indicator to show your best effort. Use the indicator as a goal to work toward during each repetition. 10. After each set of 10 deep breaths, practice coughing to be sure your lungs are clear. If you have an incision (the cut made at the time of surgery), support your incision when coughing by placing a pillow or rolled up towels firmly against it. Once you are able to get out of bed, walk around indoors  and cough well. You may stop using the incentive spirometer when instructed by your caregiver.  RISKS AND COMPLICATIONS  Take your time so you do not get dizzy or light-headed.  If you are in pain, you may need to take or ask for pain medication before doing incentive spirometry. It is harder to take a deep breath if you are having pain. AFTER USE  Rest and breathe slowly and easily.  It can  be helpful to keep track of a log of your progress. Your caregiver can provide you with a simple table to help with this. If you are using the spirometer at home, follow these instructions: [place] IF:   You are having difficultly using the spirometer.  You have trouble using the spirometer as often as instructed.  Your pain medication is not giving enough relief while using the spirometer.  You develop fever of 100.5 F (38.1 C) or higher. SEEK IMMEDIATE MEDICAL CARE IF:   You cough up bloody sputum that had not been present before.  You develop fever of 102 F (38.9 C) or greater.  You develop worsening pain at or near the incision site. MAKE SURE YOU:   Understand these instructions.  Will watch your condition.  Will get help right away if you are not doing well or get worse. Document Released: 09/05/2006 Document Revised: 07/18/2011 Document Reviewed: 11/06/2006 [facility] Patient Information 2014 [place], [place].   ________________________________________________________________________

## 2015-07-29 ENCOUNTER — Encounter (HOSPITAL_COMMUNITY): Payer: Self-pay

## 2015-07-29 ENCOUNTER — Encounter (HOSPITAL_COMMUNITY)
Admission: RE | Admit: 2015-07-29 | Discharge: 2015-07-29 | Disposition: A | Discharge status: Home / Self Care | Payer: BLUE CROSS/BLUE SHIELD | Source: Ambulatory Visit | Attending: Surgery | Admitting: Surgery

## 2015-07-29 DIAGNOSIS — Z6841 Body Mass Index (BMI) 40.0 and over, adult: Secondary | ICD-10-CM | POA: Insufficient documentation

## 2015-07-29 DIAGNOSIS — Z01812 Encounter for preprocedural laboratory examination: Secondary | ICD-10-CM | POA: Diagnosis present

## 2015-07-29 HISTORY — DX: Nausea with vomiting, unspecified: R11.2

## 2015-07-29 HISTORY — DX: Other specified postprocedural states: Z98.890

## 2015-07-29 LAB — COMPREHENSIVE METABOLIC PANEL
ALK PHOS: 81 U/L (ref 38–126)
ALT: 40 U/L (ref 17–63)
AST: 30 U/L (ref 15–41)
Albumin: 4.5 g/dL (ref 3.5–5.0)
Anion gap: 10 (ref 5–15)
BUN: 11 mg/dL (ref 6–20)
CALCIUM: 9.2 mg/dL (ref 8.9–10.3)
CO2: 26 mmol/L (ref 22–32)
CREATININE: 0.58 mg/dL — AB (ref 0.61–1.24)
Chloride: 102 mmol/L (ref 101–111)
GFR calc non Af Amer: >60 mL/min (ref >60)
Glucose, Bld: 89 mg/dL (ref 65–99)
Potassium: 4 mmol/L (ref 3.5–5.1)
SODIUM: 138 mmol/L (ref 135–145)
Total Bilirubin: 1.6 mg/dL — ABNORMAL HIGH (ref 0.3–1.2)
Total Protein: 7.6 g/dL (ref 6.5–8.1)

## 2015-07-29 LAB — CBC WITH DIFFERENTIAL/PLATELET
Basophils Absolute: 0 10*3/uL (ref 0.0–0.1)
Basophils Relative: 0 %
EOS ABS: 0.2 10*3/uL (ref 0.0–0.7)
Eosinophils Relative: 3 %
HCT: 47.4 % (ref 39.0–52.0)
HEMOGLOBIN: 15.5 g/dL (ref 13.0–17.0)
LYMPHS ABS: 2.7 10*3/uL (ref 0.7–4.0)
LYMPHS PCT: 30 %
MCH: 26.8 pg (ref 26.0–34.0)
MCHC: 32.7 g/dL (ref 30.0–36.0)
MCV: 82 fL (ref 78.0–100.0)
Monocytes Absolute: 0.6 10*3/uL (ref 0.1–1.0)
Monocytes Relative: 7 %
NEUTROS ABS: 5.4 10*3/uL (ref 1.7–7.7)
NEUTROS PCT: 60 %
Platelets: 314 10*3/uL (ref 150–400)
RBC: 5.78 MIL/uL (ref 4.22–5.81)
RDW: 12.9 % (ref 11.5–15.5)
WBC: 8.9 10*3/uL (ref 4.0–10.5)

## 2015-07-29 NOTE — Patient Instructions (Addendum)
Vincent Solis  07/29/2015   Your procedure is scheduled on: 08-04-15   [facility]  [facility] 3[facility] floor to  Short Stay Center at  0515 AM.  Call this number if you have problems the morning of surgery [phone]   Remember: ONLY 1 PERSON MAY GO WITH YOU TO SHORT STAY TO GET  READY MORNING OF YOUR SURGERY.  Do not eat food or drink liquids :After Midnight.     Take these medicines the morning of surgery with A SIP OF WATER: None.  DO NOT TAKE ANY DIABETIC MEDICATIONS DAY OF YOUR SURGERY                               You may not have any metal on your body including hair pins and              piercings  Do not wear jewelry, make-up, lotions, powders or perfumes, deodorant             Do not wear nail polish.  Do not shave  48 hours prior to surgery.              Men may shave face and neck.   Do not bring valuables to the hospital. [place] IS NOT             RESPONSIBLE   FOR VALUABLES.  Contacts, dentures or bridgework may not be worn into surgery.  Leave suitcase in the car. After surgery it may be brought to your room.     Patients discharged the day of surgery will not be allowed to drive home.  Name and phone number of your driver: [name] [phone] cell  Special Instructions: N/A              Please read over the following fact sheets you were given: _____________________________________________________________________             [facility] Pre[facility] Before surgery, you can play an important role.  Because skin is not sterile, your skin needs to be as free of germs as possible.  You can reduce the number of germs on your skin by washing with CHG (chlorahexidine gluconate) soap before surgery.  CHG is an antiseptic cleaner which kills germs and bonds with the skin to continue killing germs even after washing. Please DO NOT use if you have an allergy to CHG or antibacterial  soaps.  If your skin becomes reddened/irritated stop using the CHG and inform your nurse when you arrive at Short Stay. Do not shave (including legs and underarms) for at least 48 hours prior to the first CHG shower.  You may shave your face/neck. Please follow these instructions carefully:  1.  Shower with CHG Soap the night before surgery and the  morning of Surgery.  2.  If you choose to wash your hair, wash your hair first as usual with your  normal  shampoo.  3.  After you shampoo, rinse your hair and body thoroughly to remove the  shampoo.                           4.  Use CHG as you would any other liquid soap.  You can apply chg directly  to the skin and wash                       Gently with a scrungie or clean washcloth.  5.  Apply the CHG Soap to your body ONLY FROM THE NECK DOWN.   Do not use on face/ open                           Wound or open sores. Avoid contact with eyes, ears mouth and genitals (private parts).                       Wash face,  Genitals (private parts) with your normal soap.             6.  Wash thoroughly, paying special attention to the area where your surgery  will be performed.  7.  Thoroughly rinse your body with warm water from the neck down.  8.  DO NOT shower/wash with your normal soap after using and rinsing off  the CHG Soap.                9.  Pat yourself dry with a clean towel.            10.  Wear clean pajamas.            11.  Place clean sheets on your bed the night of your first shower and do not  sleep with pets. Day of Surgery : Do not apply any lotions/deodorants the morning of surgery.  Please wear clean clothes to the hospital/surgery center.  FAILURE TO FOLLOW THESE INSTRUCTIONS MAY RESULT IN THE CANCELLATION OF YOUR SURGERY PATIENT SIGNATURE_________________________________  NURSE SIGNATURE__________________________________  ________________________________________________________________________

## 2015-07-29 NOTE — Pre-Procedure Instructions (Addendum)
07-29-15 Dr. Aleene DavidsonE. Fitzgerald in to visit for preop consult. EKG/ CXR 2'17 Epic. Dr. Salem CasterLuking,PCP -sent fax of Stop/Bang score =5. Bari bed requested with Portable equipment.

## 2015-07-29 NOTE — Progress Notes (Signed)
07-29-15 Your Pt has screened with an elevated risk for obstructive sleep apnea using the Stop-Bang tool during a presurgical  Visit.

## 2015-08-03 NOTE — Anesthesia Preprocedure Evaluation (Signed)
Anesthesia Evaluation  Patient identified by MRN, date of birth, ID band Patient awake    Reviewed: Allergy & Precautions, H&P , NPO status , Patient's Chart, lab work & pertinent test results  History of Anesthesia Complications (+) PONV  Airway Mallampati: II  TM Distance: >3 FB Neck ROM: full    Dental no notable dental hx. (+) Dental Advisory Given, Teeth Intact   Pulmonary neg pulmonary ROS,  Tonsillar hypertrophy   Pulmonary exam normal breath sounds clear to auscultation       Cardiovascular Exercise Tolerance: Good hypertension, Pt. on medications negative cardio ROS Normal cardiovascular exam Rhythm:regular Rate:Normal     Neuro/Psych negative neurological ROS  negative psych ROS   GI/Hepatic negative GI ROS, Neg liver ROS,   Endo/Other  negative endocrine ROSMorbid obesity  Renal/GU negative Renal ROS  negative genitourinary   Musculoskeletal   Abdominal   Peds  Hematology negative hematology ROS (+)   Anesthesia Other Findings   Reproductive/Obstetrics negative OB ROS                             Anesthesia Physical Anesthesia Plan  ASA: III  Anesthesia Plan: General   Post-op Pain Management:    Induction: Intravenous  Airway Management Planned: Oral ETT  Additional Equipment:   Intra-op Plan:   Post-operative Plan: Extubation in OR  Informed Consent: I have reviewed the patients History and Physical, chart, labs and discussed the procedure including the risks, benefits and alternatives for the proposed anesthesia with the patient or authorized representative who has indicated his/her understanding and acceptance.   Dental Advisory Given  Plan Discussed with: CRNA and Surgeon  Anesthesia Plan Comments:         Anesthesia Quick Evaluation

## 2015-08-04 ENCOUNTER — Encounter (HOSPITAL_COMMUNITY): Payer: Self-pay | Admitting: *Deleted

## 2015-08-04 ENCOUNTER — Encounter (HOSPITAL_COMMUNITY)
Admission: AD | Disposition: A | Discharge status: Home / Self Care | Payer: Self-pay | Source: Ambulatory Visit | Attending: Surgery

## 2015-08-04 ENCOUNTER — Inpatient Hospital Stay (HOSPITAL_COMMUNITY)
Admission: AD | Admit: 2015-08-04 | Discharge: 2015-08-06 | DRG: 621 | Disposition: A | Discharge status: Home / Self Care | Payer: BLUE CROSS/BLUE SHIELD | Source: Ambulatory Visit | Attending: Surgery | Admitting: Surgery

## 2015-08-04 ENCOUNTER — Inpatient Hospital Stay (HOSPITAL_COMMUNITY): Payer: BLUE CROSS/BLUE SHIELD | Admitting: Anesthesiology

## 2015-08-04 DIAGNOSIS — Z9884 Bariatric surgery status: Secondary | ICD-10-CM

## 2015-08-04 DIAGNOSIS — Z01812 Encounter for preprocedural laboratory examination: Secondary | ICD-10-CM

## 2015-08-04 DIAGNOSIS — Z6841 Body Mass Index (BMI) 40.0 and over, adult: Secondary | ICD-10-CM | POA: Diagnosis not present

## 2015-08-04 HISTORY — PX: LAPAROSCOPIC GASTRIC SLEEVE RESECTION: SHX5895

## 2015-08-04 HISTORY — PX: UPPER GI ENDOSCOPY: SHX6162

## 2015-08-04 LAB — CBC
HCT: 48.6 % (ref 39.0–52.0)
Hemoglobin: 16.6 g/dL (ref 13.0–17.0)
MCH: 27.4 pg (ref 26.0–34.0)
MCHC: 34.2 g/dL (ref 30.0–36.0)
MCV: 80.2 fL (ref 78.0–100.0)
PLATELETS: 333 10*3/uL (ref 150–400)
RBC: 6.06 MIL/uL — AB (ref 4.22–5.81)
RDW: 13.1 % (ref 11.5–15.5)
WBC: 14.1 10*3/uL — AB (ref 4.0–10.5)

## 2015-08-04 LAB — CREATININE, SERUM
CREATININE: 0.88 mg/dL (ref 0.61–1.24)
GFR calc non Af Amer: >60 mL/min (ref >60)

## 2015-08-04 SURGERY — GASTRECTOMY, SLEEVE, LAPAROSCOPIC
Anesthesia: General | Site: Esophagus

## 2015-08-04 MED ORDER — ROCURONIUM BROMIDE 100 MG/10ML IV SOLN
INTRAVENOUS | Status: AC
  Filled 2015-08-04: qty 4

## 2015-08-04 MED ORDER — HYDROMORPHONE HCL 1 MG/ML IJ SOLN
0.2500 mg | INTRAMUSCULAR | Status: DC | PRN
  Administered 2015-08-04 (×2): 0.5 mg via INTRAVENOUS

## 2015-08-04 MED ORDER — SUGAMMADEX SODIUM 500 MG/5ML IV SOLN
INTRAVENOUS | Status: DC | PRN
  Administered 2015-08-04: 500 mg via INTRAVENOUS

## 2015-08-04 MED ORDER — HEPARIN SODIUM (PORCINE) 5000 UNIT/ML IJ SOLN
5000.0000 [IU] | Freq: Three times a day (TID) | INTRAMUSCULAR | Status: DC
  Administered 2015-08-04 – 2015-08-06 (×5): 5000 [IU] via SUBCUTANEOUS
  Filled 2015-08-04 (×8): qty 1

## 2015-08-04 MED ORDER — ACETAMINOPHEN 160 MG/5ML PO SOLN: 325.0000 mg | ORAL | Status: DC | PRN

## 2015-08-04 MED ORDER — ONDANSETRON HCL 4 MG/2ML IJ SOLN
INTRAMUSCULAR | Status: AC
Start: 2015-08-04 — End: 2015-08-04
  Filled 2015-08-04: qty 4

## 2015-08-04 MED ORDER — SUGAMMADEX SODIUM 500 MG/5ML IV SOLN
INTRAVENOUS | Status: AC
  Filled 2015-08-04: qty 5

## 2015-08-04 MED ORDER — PROMETHAZINE HCL 25 MG/ML IJ SOLN
INTRAMUSCULAR | Status: AC
  Filled 2015-08-04: qty 1

## 2015-08-04 MED ORDER — ONDANSETRON HCL 4 MG/2ML IJ SOLN
INTRAMUSCULAR | Status: DC | PRN
  Administered 2015-08-04 (×2): 4 mg via INTRAVENOUS

## 2015-08-04 MED ORDER — ENALAPRILAT 1.25 MG/ML IV SOLN
1.2500 mg | Freq: Four times a day (QID) | INTRAVENOUS | Status: DC | PRN
  Administered 2015-08-05 (×2): 1.25 mg via INTRAVENOUS
  Filled 2015-08-04 (×4): qty 1

## 2015-08-04 MED ORDER — FENTANYL CITRATE (PF) 100 MCG/2ML IJ SOLN
INTRAMUSCULAR | Status: DC | PRN
  Administered 2015-08-04 (×2): 50 ug via INTRAVENOUS
  Administered 2015-08-04: 150 ug via INTRAVENOUS

## 2015-08-04 MED ORDER — MIDAZOLAM HCL 2 MG/2ML IJ SOLN
INTRAMUSCULAR | Status: AC
  Filled 2015-08-04: qty 2

## 2015-08-04 MED ORDER — LACTATED RINGERS IV SOLN: INTRAVENOUS | Status: DC

## 2015-08-04 MED ORDER — EPHEDRINE SULFATE 50 MG/ML IJ SOLN
INTRAMUSCULAR | Status: AC
  Filled 2015-08-04: qty 1

## 2015-08-04 MED ORDER — LABETALOL HCL 5 MG/ML IV SOLN
5.0000 mg | Freq: Once | INTRAVENOUS | Status: AC
  Administered 2015-08-04: 5 mg via INTRAVENOUS

## 2015-08-04 MED ORDER — LIDOCAINE HCL (CARDIAC) 20 MG/ML IV SOLN
INTRAVENOUS | Status: DC | PRN
  Administered 2015-08-04: 80 mg via INTRAVENOUS

## 2015-08-04 MED ORDER — FENTANYL CITRATE (PF) 250 MCG/5ML IJ SOLN
INTRAMUSCULAR | Status: AC
  Filled 2015-08-04: qty 5

## 2015-08-04 MED ORDER — CHLORHEXIDINE GLUCONATE CLOTH 2 % EX PADS: 6.0000 | MEDICATED_PAD | Freq: Once | CUTANEOUS | Status: DC

## 2015-08-04 MED ORDER — BUPIVACAINE LIPOSOME 1.3 % IJ SUSP
20.0000 mL | Freq: Once | INTRAMUSCULAR | Status: AC
  Administered 2015-08-04: 20 mL
  Filled 2015-08-04: qty 20

## 2015-08-04 MED ORDER — LACTATED RINGERS IR SOLN
Status: DC | PRN
  Administered 2015-08-04: 1000 mL

## 2015-08-04 MED ORDER — CETYLPYRIDINIUM CHLORIDE 0.05 % MT LIQD
7.0000 mL | Freq: Two times a day (BID) | OROMUCOSAL | Status: DC
  Administered 2015-08-04 – 2015-08-05 (×4): 7 mL via OROMUCOSAL

## 2015-08-04 MED ORDER — DEXTROSE 5 % IV SOLN
INTRAVENOUS | Status: AC
  Filled 2015-08-04: qty 2

## 2015-08-04 MED ORDER — ROCURONIUM BROMIDE 100 MG/10ML IV SOLN
INTRAVENOUS | Status: DC | PRN
  Administered 2015-08-04: 50 mg via INTRAVENOUS
  Administered 2015-08-04: 10 mg via INTRAVENOUS
  Administered 2015-08-04: 5 mg via INTRAVENOUS
  Administered 2015-08-04: 10 mg via INTRAVENOUS
  Administered 2015-08-04: 15 mg via INTRAVENOUS

## 2015-08-04 MED ORDER — PHENYLEPHRINE 40 MCG/ML (10ML) SYRINGE FOR IV PUSH (FOR BLOOD PRESSURE SUPPORT)
PREFILLED_SYRINGE | INTRAVENOUS | Status: AC
  Filled 2015-08-04: qty 10

## 2015-08-04 MED ORDER — EPHEDRINE SULFATE 50 MG/ML IJ SOLN
INTRAMUSCULAR | Status: DC | PRN
  Administered 2015-08-04: 5 mg via INTRAVENOUS

## 2015-08-04 MED ORDER — KCL IN DEXTROSE-NACL 20-5-0.45 MEQ/L-%-% IV SOLN
INTRAVENOUS | Status: DC
  Administered 2015-08-04 – 2015-08-06 (×5): via INTRAVENOUS
  Filled 2015-08-04 (×7): qty 1000

## 2015-08-04 MED ORDER — HYDROMORPHONE HCL 1 MG/ML IJ SOLN
INTRAMUSCULAR | Status: AC
  Filled 2015-08-04: qty 1

## 2015-08-04 MED ORDER — ONDANSETRON HCL 4 MG/2ML IJ SOLN
INTRAMUSCULAR | Status: AC
  Filled 2015-08-04: qty 4

## 2015-08-04 MED ORDER — LABETALOL HCL 5 MG/ML IV SOLN
5.0000 mg | INTRAVENOUS | Status: AC | PRN
  Administered 2015-08-04 (×4): 5 mg via INTRAVENOUS
  Filled 2015-08-04 (×5): qty 4

## 2015-08-04 MED ORDER — DEXAMETHASONE SODIUM PHOSPHATE 10 MG/ML IJ SOLN
INTRAMUSCULAR | Status: DC | PRN
  Administered 2015-08-04: 10 mg via INTRAVENOUS

## 2015-08-04 MED ORDER — SODIUM CHLORIDE 0.9 % IJ SOLN
INTRAMUSCULAR | Status: DC | PRN
  Administered 2015-08-04: 10 mL

## 2015-08-04 MED ORDER — HYDROMORPHONE HCL 1 MG/ML IJ SOLN
INTRAMUSCULAR | Status: DC | PRN
  Administered 2015-08-04 (×4): 0.5 mg via INTRAVENOUS

## 2015-08-04 MED ORDER — CHLORHEXIDINE GLUCONATE 0.12 % MT SOLN
15.0000 mL | Freq: Two times a day (BID) | OROMUCOSAL | Status: DC
  Administered 2015-08-04 – 2015-08-05 (×4): 15 mL via OROMUCOSAL
  Filled 2015-08-04 (×6): qty 15

## 2015-08-04 MED ORDER — PANTOPRAZOLE SODIUM 40 MG IV SOLR
40.0000 mg | Freq: Every day | INTRAVENOUS | Status: DC
  Administered 2015-08-04 – 2015-08-05 (×2): 40 mg via INTRAVENOUS
  Filled 2015-08-04 (×3): qty 40

## 2015-08-04 MED ORDER — LIDOCAINE HCL (CARDIAC) 20 MG/ML IV SOLN
INTRAVENOUS | Status: AC
  Filled 2015-08-04: qty 5

## 2015-08-04 MED ORDER — DEXTROSE 5 % IV SOLN
2.0000 g | INTRAVENOUS | Status: AC
  Administered 2015-08-04: 2 g via INTRAVENOUS

## 2015-08-04 MED ORDER — SODIUM CHLORIDE 0.9 % IJ SOLN
INTRAMUSCULAR | Status: AC
  Filled 2015-08-04: qty 10

## 2015-08-04 MED ORDER — PREMIER PROTEIN SHAKE
2.0000 [oz_av] | Freq: Four times a day (QID) | ORAL | Status: DC
  Administered 2015-08-06 (×2): 2 [oz_av] via ORAL

## 2015-08-04 MED ORDER — MORPHINE SULFATE (PF) 2 MG/ML IV SOLN
2.0000 mg | INTRAVENOUS | Status: DC | PRN
  Administered 2015-08-04 (×4): 4 mg via INTRAVENOUS
  Administered 2015-08-04: 2 mg via INTRAVENOUS
  Administered 2015-08-05 (×4): 4 mg via INTRAVENOUS
  Administered 2015-08-05: 6 mg via INTRAVENOUS
  Administered 2015-08-05: 4 mg via INTRAVENOUS
  Administered 2015-08-06: 2 mg via INTRAVENOUS
  Filled 2015-08-04 (×4): qty 2
  Filled 2015-08-04: qty 1
  Filled 2015-08-04 (×4): qty 2
  Filled 2015-08-04: qty 3
  Filled 2015-08-04: qty 2
  Filled 2015-08-04: qty 1

## 2015-08-04 MED ORDER — DEXAMETHASONE SODIUM PHOSPHATE 10 MG/ML IJ SOLN
INTRAMUSCULAR | Status: AC
  Filled 2015-08-04: qty 1

## 2015-08-04 MED ORDER — PROPOFOL 10 MG/ML IV BOLUS
INTRAVENOUS | Status: DC | PRN
  Administered 2015-08-04: 200 mg via INTRAVENOUS

## 2015-08-04 MED ORDER — SUCCINYLCHOLINE CHLORIDE 20 MG/ML IJ SOLN
INTRAMUSCULAR | Status: DC | PRN
  Administered 2015-08-04: 150 mg via INTRAVENOUS

## 2015-08-04 MED ORDER — ACETAMINOPHEN 160 MG/5ML PO SOLN: 650.0000 mg | ORAL | Status: DC | PRN

## 2015-08-04 MED ORDER — PHENYLEPHRINE HCL 10 MG/ML IJ SOLN
INTRAMUSCULAR | Status: DC | PRN
  Administered 2015-08-04 (×7): 80 ug via INTRAVENOUS

## 2015-08-04 MED ORDER — PROPOFOL 10 MG/ML IV BOLUS
INTRAVENOUS | Status: AC
  Filled 2015-08-04: qty 40

## 2015-08-04 MED ORDER — OXYCODONE HCL 5 MG/5ML PO SOLN
5.0000 mg | ORAL | Status: DC | PRN
  Administered 2015-08-05: 5 mg via ORAL
  Administered 2015-08-05 – 2015-08-06 (×3): 10 mg via ORAL
  Filled 2015-08-04: qty 10
  Filled 2015-08-04: qty 5
  Filled 2015-08-04: qty 50
  Filled 2015-08-04: qty 10

## 2015-08-04 MED ORDER — HEPARIN SODIUM (PORCINE) 5000 UNIT/ML IJ SOLN
5000.0000 [IU] | INTRAMUSCULAR | Status: AC
  Administered 2015-08-04: 5000 [IU] via SUBCUTANEOUS
  Filled 2015-08-04: qty 1

## 2015-08-04 MED ORDER — 0.9 % SODIUM CHLORIDE (POUR BTL) OPTIME
TOPICAL | Status: DC | PRN
  Administered 2015-08-04: 1000 mL

## 2015-08-04 MED ORDER — PROMETHAZINE HCL 25 MG/ML IJ SOLN
6.2500 mg | INTRAMUSCULAR | Status: DC | PRN
  Administered 2015-08-04: 12.5 mg via INTRAVENOUS
  Administered 2015-08-04 (×2): 6.25 mg via INTRAVENOUS
  Administered 2015-08-05: 25 mg via INTRAVENOUS
  Filled 2015-08-04 (×2): qty 1

## 2015-08-04 MED ORDER — METOCLOPRAMIDE HCL 5 MG/ML IJ SOLN
INTRAMUSCULAR | Status: DC | PRN
  Administered 2015-08-04: 10 mg via INTRAVENOUS

## 2015-08-04 MED ORDER — ONDANSETRON HCL 4 MG/2ML IJ SOLN
4.0000 mg | INTRAMUSCULAR | Status: DC | PRN
  Administered 2015-08-04 – 2015-08-05 (×7): 4 mg via INTRAVENOUS
  Filled 2015-08-04 (×7): qty 2

## 2015-08-04 MED ORDER — METOCLOPRAMIDE HCL 5 MG/ML IJ SOLN
INTRAMUSCULAR | Status: AC
  Filled 2015-08-04: qty 2

## 2015-08-04 MED ORDER — LACTATED RINGERS IV SOLN
INTRAVENOUS | Status: DC | PRN
  Administered 2015-08-04 (×2): via INTRAVENOUS

## 2015-08-04 MED ORDER — MIDAZOLAM HCL 5 MG/5ML IJ SOLN
INTRAMUSCULAR | Status: DC | PRN
  Administered 2015-08-04: 2 mg via INTRAVENOUS

## 2015-08-04 MED ORDER — HYDROMORPHONE HCL 2 MG/ML IJ SOLN
INTRAMUSCULAR | Status: AC
  Filled 2015-08-04: qty 1

## 2015-08-04 SURGICAL SUPPLY — 69 items
APL SRG 32X5 SNPLK LF DISP (MISCELLANEOUS)
APPLICATOR COTTON TIP 6IN STRL (MISCELLANEOUS) IMPLANT
APPLIER CLIP 5 13 M/L LIGAMAX5 (MISCELLANEOUS)
APPLIER CLIP ROT 10 11.4 M/L (STAPLE)
APPLIER CLIP ROT 13.4 12 LRG (CLIP)
APR CLP LRG 13.4X12 ROT 20 MLT (CLIP)
APR CLP MED LRG 11.4X10 (STAPLE)
APR CLP MED LRG 5 ANG JAW (MISCELLANEOUS)
BAG SPEC RTRVL LRG 6X4 10 (ENDOMECHANICALS) ×2
BLADE SURG 15 STRL LF DISP TIS (BLADE) ×2 IMPLANT
BLADE SURG 15 STRL SS (BLADE) ×3
CABLE HIGH FREQUENCY MONO STRZ (ELECTRODE) ×2 IMPLANT
CLIP APPLIE 5 13 M/L LIGAMAX5 (MISCELLANEOUS) IMPLANT
CLIP APPLIE ROT 10 11.4 M/L (STAPLE) IMPLANT
CLIP APPLIE ROT 13.4 12 LRG (CLIP) IMPLANT
COVER SURGICAL LIGHT HANDLE (MISCELLANEOUS) ×2 IMPLANT
DEVICE SUT QUICK LOAD TK 5 (STAPLE) IMPLANT
DEVICE SUT TI-KNOT TK 5X26 (MISCELLANEOUS) IMPLANT
DEVICE SUTURE ENDOST 10MM (ENDOMECHANICALS) IMPLANT
DEVICE TROCAR PUNCTURE CLOSURE (ENDOMECHANICALS) ×3 IMPLANT
DISSECTOR BLUNT TIP ENDO 5MM (MISCELLANEOUS) ×2 IMPLANT
ELECT REM PT RETURN 9FT ADLT (ELECTROSURGICAL) ×3
ELECTRODE REM PT RTRN 9FT ADLT (ELECTROSURGICAL) ×2 IMPLANT
GAUZE SPONGE 4X4 12PLY STRL (GAUZE/BANDAGES/DRESSINGS) IMPLANT
GLOVE BIOGEL M 8.0 STRL (GLOVE) ×3 IMPLANT
GOWN STRL REUS W/TWL XL LVL3 (GOWN DISPOSABLE) ×12 IMPLANT
HANDLE STAPLE EGIA 4 XL (STAPLE) ×3 IMPLANT
HOVERMATT SINGLE USE (MISCELLANEOUS) ×3 IMPLANT
KIT BASIN OR (CUSTOM PROCEDURE TRAY) ×3 IMPLANT
LIQUID BAND (GAUZE/BANDAGES/DRESSINGS) ×1 IMPLANT
MARKER SKIN DUAL TIP RULER LAB (MISCELLANEOUS) ×3 IMPLANT
NDL SPNL 22GX3.5 QUINCKE BK (NEEDLE) ×2 IMPLANT
NEEDLE SPNL 22GX3.5 QUINCKE BK (NEEDLE) ×3 IMPLANT
PACK UNIVERSAL I (CUSTOM PROCEDURE TRAY) ×3 IMPLANT
POUCH SPECIMEN RETRIEVAL 10MM (ENDOMECHANICALS) ×1 IMPLANT
RELOAD STAPLE 45 PURP MED/THCK (STAPLE) IMPLANT
RELOAD TRI 45 ART MED THCK BLK (STAPLE) ×4 IMPLANT
RELOAD TRI 45 ART MED THCK PUR (STAPLE) IMPLANT
RELOAD TRI 60 ART MED THCK BLK (STAPLE) ×5 IMPLANT
RELOAD TRI 60 ART MED THCK PUR (STAPLE) ×4 IMPLANT
SCISSORS LAP 5X45 EPIX DISP (ENDOMECHANICALS) ×1 IMPLANT
SCRUB PCMX 4 OZ (MISCELLANEOUS) ×5 IMPLANT
SEALANT SURGICAL APPL DUAL CAN (MISCELLANEOUS) IMPLANT
SET IRRIG TUBING LAPAROSCOPIC (IRRIGATION / IRRIGATOR) ×3 IMPLANT
SET TRI-LUMEN FLTR TB AIRSEAL (TUBING) ×2 IMPLANT
SHEARS HARMONIC ACE PLUS 45CM (MISCELLANEOUS) ×3 IMPLANT
SLEEVE ADV FIXATION 5X100MM (TROCAR) ×6 IMPLANT
SLEEVE GASTRECTOMY 36FR VISIGI (MISCELLANEOUS) ×3 IMPLANT
SOLUTION ANTI FOG 6CC (MISCELLANEOUS) ×3 IMPLANT
SPONGE LAP 18X18 X RAY DECT (DISPOSABLE) ×3 IMPLANT
STAPLER VISISTAT 35W (STAPLE) ×3 IMPLANT
SUT SURGIDAC NAB ES-9 0 48 120 (SUTURE) IMPLANT
SUT VIC AB 4-0 SH 18 (SUTURE) ×3 IMPLANT
SUT VICRYL 0 TIES 12 18 (SUTURE) ×3 IMPLANT
SYR 10ML ECCENTRIC (SYRINGE) ×3 IMPLANT
SYR 20CC LL (SYRINGE) ×3 IMPLANT
SYR 50ML LL SCALE MARK (SYRINGE) ×3 IMPLANT
TOWEL OR 17X26 10 PK STRL BLUE (TOWEL DISPOSABLE) ×6 IMPLANT
TOWEL OR NON WOVEN STRL DISP B (DISPOSABLE) ×3 IMPLANT
TRAY FOLEY W/METER SILVER 14FR (SET/KITS/TRAYS/PACK) IMPLANT
TRAY FOLEY W/METER SILVER 16FR (SET/KITS/TRAYS/PACK) IMPLANT
TROCAR ADV FIXATION 12X100MM (TROCAR) ×3 IMPLANT
TROCAR ADV FIXATION 5X100MM (TROCAR) ×3 IMPLANT
TROCAR BLADELESS 15MM (ENDOMECHANICALS) ×3 IMPLANT
TROCAR BLADELESS OPT 5 100 (ENDOMECHANICALS) ×3 IMPLANT
TROCAR PORT AIRSEAL 5X120 (TROCAR) ×3 IMPLANT
TUBE CALIBRATION LAPBAND (TUBING) IMPLANT
TUBING CONNECTING 10 (TUBING) ×3 IMPLANT
TUBING ENDO SMARTCAP (MISCELLANEOUS) ×3 IMPLANT

## 2015-08-04 NOTE — Transfer of Care (Signed)
Immediate Anesthesia Transfer of Care Note  Patient: Vincent Solis  Procedure(s) Performed: Procedure(s): LAPAROSCOPIC GASTRIC SLEEVE RESECTION (N/A) UPPER GI ENDOSCOPY (N/A)  Patient Location: PACU  Anesthesia Type:General  Level of Consciousness:  sedated, patient cooperative and responds to stimulation  Airway & Oxygen Therapy:Patient Spontanous Breathing and Patient connected to face mask oxgen  Post-op Assessment:  Report given to PACU RN and Post -op Vital signs reviewed and stable  Post vital signs:  Reviewed and stable  Last Vitals:  Filed Vitals:   08/04/15 0515  BP: 168/109  Pulse: 62  Temp: 37 C  Resp: 16    Complications: No apparent anesthesia complications

## 2015-08-04 NOTE — Interval H&P Note (Signed)
History and Physical Interval Note:  08/04/2015 7:12 AM  Vincent Solis  has presented today for surgery, with the diagnosis of MORBID OBESITY  The various methods of treatment have been discussed with the patient and family. After consideration of risks, benefits and other options for treatment, the patient has consented to  Procedure(s): LAPAROSCOPIC GASTRIC SLEEVE RESECTION (N/A) as a surgical intervention .  The patient's history has been reviewed, patient examined, no change in status, stable for surgery.  I have reviewed the patient's chart and labs.  Questions were answered to the patient's satisfaction.     Lyn Deemer B

## 2015-08-04 NOTE — Op Note (Signed)
Surgeon: Wenda LowMatt Dijon Cosens, MD, FACS  Asst:  Feliciana RossettiLuke Kinsinger, MD  Anes:  General endotracheal  Procedure: Laparoscopic sleeve gastrectomy and upper endoscopy  Diagnosis: Morbid obesity  Complications: None noted  EBL:   5 cc  Description of Procedure:  The patient was take to OR 1 and given general anesthesia.  The abdomen was prepped with PCMX and draped sterilely.  A timeout was performed.  Access to the abdomen was achieved with with a 5 mm Optiview through the left upper quadrant.  Following insufflation, the state of the abdomen was found to be free of adhesions.  The ViSiGi 36Fr tube was inserted to deflate the stomach and was pulled back into the esophagus.    The pylorus was identified and we measured 5 cm back and marked the antrum.  At that point we began dissection to take down the greater curvature of the stomach using the Harmonic scalpel.  This dissection was taken all the way up to the left crus.  Posterior attachments of the stomach were also taken down.    The ViSiGi tube was then passed into the antrum and suction applied so that it was snug along the lessor curvature.  The "crow's foot" or incisura was identified.  The sleeve gastrectomy was begun using the Lexmark InternationalCovidien platform stapler beginning with a 4.5 cm black with TRS followed by a 6 cm black with TRS and then purple 6 cm with TRS.  When the sleeve was complete the tube was taken off suction and insufflated briefly.  The tube was withdrawn.  Upper endoscopy was then performed by Dr. Sheliah HatchKinsinger which showed no bleeding and no leaks.  Some solid food material was seen.     The specimen was extracted through the 15 trocar site which was closed with a single 0 vicryl.  Wounds were infiltrated with Exparel and closed with 4-0 vicryl.    Matt B. Daphine DeutscherMartin, MD, [facility], [place] [phone]

## 2015-08-04 NOTE — Anesthesia Procedure Notes (Signed)
Procedure Name: Intubation Date/Time: 08/04/2015 7:26 AM Performed by: Shaylea Ucci, Nuala AlphaKRISTOPHER Pre-anesthesia Checklist: Patient identified, Emergency Drugs available, Suction available, Patient being monitored and Timeout performed Patient Re-evaluated:Patient Re-evaluated prior to inductionOxygen Delivery Method: Circle system utilized Preoxygenation: Pre-oxygenation with 100% oxygen Intubation Type: IV induction Ventilation: Mask ventilation without difficulty Laryngoscope Size: Mac and 4 Grade View: Grade II Tube type: Oral Tube size: 7.5 mm Number of attempts: 1 Airway Equipment and Method: Stylet Placement Confirmation: ETT inserted through vocal cords under direct vision,  positive ETCO2,  CO2 detector and breath sounds checked- equal and bilateral Secured at: 23 cm Tube secured with: Tape Dental Injury: Teeth and Oropharynx as per pre-operative assessment

## 2015-08-04 NOTE — Anesthesia Postprocedure Evaluation (Signed)
Anesthesia Post Note  Patient: Vincent Solis A Nurse  Procedure(s) Performed: Procedure(s) (LRB): LAPAROSCOPIC GASTRIC SLEEVE RESECTION (N/A) UPPER GI ENDOSCOPY (N/A)  Patient location during evaluation: PACU Anesthesia Type: General Level of consciousness: awake and alert Pain management: pain level controlled Vital Signs Assessment: post-procedure vital signs reviewed and stable Respiratory status: spontaneous breathing, nonlabored ventilation, respiratory function stable and patient connected to nasal cannula oxygen Cardiovascular status: blood pressure returned to baseline and stable Postop Assessment: no signs of nausea or vomiting Anesthetic complications: no    Last Vitals:  Filed Vitals:   08/04/15 1134 08/04/15 1245  BP: 168/93 174/85  Pulse: 79 81  Temp: 36.7 C 36.7 C  Resp: 15 16    Last Pain:  Filed Vitals:   08/04/15 1344  PainSc: 4                  Vincent Solis L

## 2015-08-04 NOTE — Progress Notes (Signed)
Spoke with pharmacy regarding early administration of Zofran. Pharmacist stated common side effects with close administration and /or things to be aware of are constipation & headache. Will continue to monitor patient

## 2015-08-05 LAB — CBC WITH DIFFERENTIAL/PLATELET
BASOS PCT: 0 %
Basophils Absolute: 0 10*3/uL (ref 0.0–0.1)
EOS ABS: 0 10*3/uL (ref 0.0–0.7)
Eosinophils Relative: 0 %
HEMATOCRIT: 43.2 % (ref 39.0–52.0)
HEMOGLOBIN: 14.9 g/dL (ref 13.0–17.0)
LYMPHS ABS: 2.1 10*3/uL (ref 0.7–4.0)
Lymphocytes Relative: 12 %
MCH: 27.2 pg (ref 26.0–34.0)
MCHC: 34.5 g/dL (ref 30.0–36.0)
MCV: 78.8 fL (ref 78.0–100.0)
Monocytes Absolute: 1.8 10*3/uL — ABNORMAL HIGH (ref 0.1–1.0)
Monocytes Relative: 10 %
NEUTROS ABS: 13.4 10*3/uL — AB (ref 1.7–7.7)
NEUTROS PCT: 78 %
Platelets: 310 10*3/uL (ref 150–400)
RBC: 5.48 MIL/uL (ref 4.22–5.81)
RDW: 12.8 % (ref 11.5–15.5)
WBC: 17.3 10*3/uL — AB (ref 4.0–10.5)

## 2015-08-05 LAB — HEMOGLOBIN AND HEMATOCRIT, BLOOD
HEMATOCRIT: 42.3 % (ref 39.0–52.0)
HEMOGLOBIN: 14.6 g/dL (ref 13.0–17.0)

## 2015-08-05 MED ORDER — PROMETHAZINE HCL 25 MG RE SUPP: 25.0000 mg | Freq: Four times a day (QID) | RECTAL | Status: DC | PRN

## 2015-08-05 MED ORDER — KETOROLAC TROMETHAMINE 15 MG/ML IJ SOLN
INTRAMUSCULAR | Status: AC
Start: 2015-08-05 — End: 2015-08-05
  Filled 2015-08-05: qty 1

## 2015-08-05 MED ORDER — KETOROLAC TROMETHAMINE 15 MG/ML IJ SOLN
15.0000 mg | Freq: Once | INTRAMUSCULAR | Status: AC
  Administered 2015-08-05: 15 mg via INTRAVENOUS

## 2015-08-05 NOTE — Plan of Care (Signed)
Problem: Food- and Nutrition-Related Knowledge Deficit (NB-1.1) Goal: Nutrition education Formal process to instruct or train a patient/client in a skill or to impart knowledge to help patients/clients voluntarily manage or modify food choices and eating behavior to maintain or improve health. Outcome: Completed/Met Date Met:  08/05/15 Nutrition Education Note  Received consult for diet education per DROP protocol.   Discussed 2 week post op diet with pt. Emphasized that liquids must be non carbonated, non caffeinated, and sugar free. Fluid goals discussed. Reviewed progression of diet to include soft proteins at 7-10 days post-op. Pt to follow up with outpatient bariatric RD for further diet progression after 2 weeks. Multivitamins and minerals also reviewed. Teach back method used, pt expressed understanding, expect good compliance.   Diet: First 2 Weeks  You will see the dietitian about two (2) weeks after your surgery. The dietitian will increase the types of foods you can eat if you are handling liquids well:  If you have severe vomiting or nausea and cannot handle clear liquids lasting longer than 1 day, call your surgeon  Protein Shake  Drink at least 2 ounces of shake 5-6 times per day  Each serving of protein shakes (usually 8 - 12 ounces) should have a minimum of:  15 grams of protein  And no more than 5 grams of carbohydrate  Goal for protein each day:  Men = 80 grams per day  Women = 60 grams per day  Protein powder may be added to fluids such as non-fat milk or Lactaid milk or Soy milk (limit to 35 grams added protein powder per serving)   Hydration  Slowly increase the amount of water and other clear liquids as tolerated (See Acceptable Fluids)  Slowly increase the amount of protein shake as tolerated  Sip fluids slowly and throughout the day  May use sugar substitutes in small amounts (no more than 6 - 8 packets per day; i.e. Splenda)   Fluid Goal  The first goal is to  drink at least 8 ounces of protein shake/drink per day (or as directed by the nutritionist); some examples of protein shakes are [employer], [employer], EAS Edge HP, and Unjury. See handout from pre-op Bariatric Education Class:  Slowly increase the amount of protein shake you drink as tolerated  You may find it easier to slowly sip shakes throughout the day  It is important to get your proteins in first  Your fluid goal is to drink 64 - 100 ounces of fluid daily  It may take a few weeks to build up to this  32 oz (or more) should be clear liquids  And  32 oz (or more) should be full liquids (see below for examples)  Liquids should not contain sugar, caffeine, or carbonation   Clear Liquids:  Water or Sugar-free flavored water (i.e. Fruit H2O, Propel)  Decaffeinated coffee or tea (sugar-free)  Crystal Lite, Wyler's Lite, Minute Maid Lite  Sugar-free Jell-O  Bouillon or broth  Sugar-free Popsicle: *Less than 20 calories each; Limit 1 per day   Full Liquids:  Protein Shakes/Drinks + 2 choices per day of other full liquids  Full liquids must be:  No More Than 12 grams of Carbs per serving  No More Than 3 grams of Fat per serving  Strained low-fat cream soup  Non-Fat milk  Fat-free Lactaid Milk  Sugar-free yogurt (Dannon Lite & Fit, Greek yogurt)     Clayton Bibles, MS, RD, LDN Pager: [phone] After Hours Pager: [phone]

## 2015-08-06 LAB — CBC WITH DIFFERENTIAL/PLATELET
Basophils Absolute: 0 10*3/uL (ref 0.0–0.1)
Basophils Relative: 0 %
EOS ABS: 0.1 10*3/uL (ref 0.0–0.7)
EOS PCT: 0 %
HCT: 44.2 % (ref 39.0–52.0)
Hemoglobin: 14.6 g/dL (ref 13.0–17.0)
LYMPHS ABS: 2.1 10*3/uL (ref 0.7–4.0)
Lymphocytes Relative: 15 %
MCH: 27.2 pg (ref 26.0–34.0)
MCHC: 33 g/dL (ref 30.0–36.0)
MCV: 82.3 fL (ref 78.0–100.0)
Monocytes Absolute: 2.1 10*3/uL — ABNORMAL HIGH (ref 0.1–1.0)
Monocytes Relative: 15 %
Neutro Abs: 10 10*3/uL — ABNORMAL HIGH (ref 1.7–7.7)
Neutrophils Relative %: 70 %
PLATELETS: 269 10*3/uL (ref 150–400)
RBC: 5.37 MIL/uL (ref 4.22–5.81)
RDW: 13.4 % (ref 11.5–15.5)
WBC: 14.4 10*3/uL — AB (ref 4.0–10.5)

## 2015-08-06 MED ORDER — PROMETHAZINE HCL 25 MG RE SUPP: 25.0000 mg | Freq: Four times a day (QID) | RECTAL | Status: DC | PRN

## 2015-08-06 NOTE — Discharge Instructions (Signed)

## 2015-08-06 NOTE — Op Note (Signed)
Preoperative diagnosis: laparoscopic sleeve gastrectomy  Postoperative diagnosis: Same   Procedure: Upper endoscopy   Surgeon: Treyshaun Keatts, M.D.  Anesthesia: Gen.   Indications for procedure: This patient was undergoing a laparoscopic sleeve gastrectomy.   Description of procedure: The endoscopy was placed in the mouth and into the oropharynx and under endoscopic vision it was advanced to the esophagogastric junction. The pouch was insufflated and no bleeding or bubbles were seen. The GEJ was identified at 44cm from the teeth. No bleeding or leaks were detected. The scope was withdrawn without difficulty.   Ashleigh Arya, M.D. General, Bariatric, & Minimally Invasive Surgery [facility]     

## 2015-08-06 NOTE — Discharge Summary (Signed)
Physician Discharge Summary  Patient ID: Vincent Solis MRN: [id number] DOB/AGE: 11/07/1995 19 y.o.  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  Some nausea.  Incisions OK.  Diet advanced. Ready for discharge on PD 2  Consults: none  Significant Diagnostic Studies: none    Discharge Exam: Blood pressure 142/79, pulse 84, temperature 98.9 F (37.2 C), temperature source Oral, resp. rate 18, height 6\' 4"  (1.93 m), weight 175.3 kg (386 lb 7.5 oz), SpO2 99 %. Incisions ok.   Disposition: 01-Home or Self Care  Discharge Instructions    Ambulate hourly while awake    Complete by:  As directed      Call MD for:  difficulty breathing, headache or visual disturbances    Complete by:  As directed      Call MD for:  persistant dizziness or light-headedness    Complete by:  As directed      Call MD for:  persistant nausea and vomiting    Complete by:  As directed      Call MD for:  redness, tenderness, or signs of infection (pain, swelling, redness, odor or green/yellow discharge around incision site)    Complete by:  As directed      Call MD for:  severe uncontrolled pain    Complete by:  As directed      Call MD for:  temperature >101 F    Complete by:  As directed      Diet bariatric full liquid    Complete by:  As directed      Incentive spirometry    Complete by:  As directed   Perform hourly while awake            Medication List    TAKE these medications        enalapril 5 MG tablet  Commonly known as:  VASOTEC  Take 1 tablet (5 mg total) by mouth daily.  Notes to Patient:  Monitor Blood Pressure Daily and keep a log for primary care physician.  You may need to make changes to your medications with rapid weight loss.         multivitamin with minerals Tabs tablet  Take 1 tablet by mouth daily. Reported on 07/27/2015     promethazine 25 MG suppository  Commonly known as:  PHENERGAN  Place 1 suppository (25 mg total) rectally every 6 (six) hours as needed for nausea or vomiting.           Follow-up Information    Follow up with Valarie MerinoMARTIN,Reade Trefz B, MD. Go on 08/18/2015.   Specialty:  General Surgery   Why:  For Post-Op Check at [facility], [address] [place] [address] at 1:30 PM   Contact information:   [address] [place] [address] [phone]       Follow up with Valarie MerinoMARTIN,Ling Flesch B, MD. Go on 09/25/2015.   Specialty:  General Surgery   Why:  For Post-Op Check at 1:30 PM in [facility]   Contact information:   [address] [place] [address] [phone]       Signed: Valarie MerinoMARTIN,Calypso Hagarty B 08/06/2015, 1:27 PM

## 2015-08-06 NOTE — Progress Notes (Signed)
Patient alert and oriented, pain is controlled. Patient is tolerating fluids,  advanced to protein shake today, patient tolerated well.  Reviewed Gastric sleeve discharge instructions with patient and patient is able to articulate understanding.  Provided information on BELT program, Support Group and [facility] outpatient pharmacy. All questions answered, will continue to monitor.  

## 2015-08-07 ENCOUNTER — Telehealth: Payer: Self-pay | Admitting: Family Medicine

## 2015-08-07 MED ORDER — ENALAPRIL MALEATE 5 MG PO TABS: 5.0000 mg | ORAL_TABLET | Freq: Every day | ORAL | Status: DC

## 2015-08-07 NOTE — Telephone Encounter (Signed)
Okay +2 months refills

## 2015-08-07 NOTE — Telephone Encounter (Signed)
Dad notified med sent to pharmacy.

## 2015-08-07 NOTE — Telephone Encounter (Signed)
Patient had gastric surgery this week and patients father is requesting a refill on his enalapril (VASOTEC) 5 MG tablet.   [facility] [address]

## 2015-08-12 ENCOUNTER — Telehealth (HOSPITAL_COMMUNITY): Payer: Self-pay

## 2015-08-14 NOTE — Telephone Encounter (Signed)
Made discharge phone call to patient per DROP protocol. Asking the following questions.    1. Do you have someone to care for you now that you are home?  yes 2. Are you having pain now that is not relieved by your pain medication?  No pain in several days 3. Are you able to drink the recommended daily amount of fluids (48 ounces minimum/day) and protein (60-80 grams/day) as prescribed by the dietitian or nutritional counselor?  Yes, I hate protein shakes, at least 2 shakes and day and I am eating yogurt, egg, etc 4. Are you taking the vitamins and minerals as prescribed?  yes 5. Do you have the "on call" number to contact your surgeon if you have a problem or question?   6. Are your incisions free of redness, swelling or drainage? (If steri strips, address that these can fall off, shower as tolerated) yes 7. Have your bowels moved since your surgery?  If not, are you passing gas?  yes 8. Are you up and walking 3-4 times per day?  yes

## 2015-08-18 ENCOUNTER — Encounter: Payer: BLUE CROSS/BLUE SHIELD | Attending: Surgery

## 2015-08-18 DIAGNOSIS — Z6841 Body Mass Index (BMI) 40.0 and over, adult: Secondary | ICD-10-CM | POA: Insufficient documentation

## 2015-08-18 NOTE — Progress Notes (Signed)
  Follow-up visit:  2 Weeks Post-Operative Sleeve Gastrectomy Surgery  Medical Nutrition Therapy:  Appt start time: 7654 end time:  6503.  Primary concerns today: Post-operative Bariatric Surgery Nutrition Management. Returns with a 38 lb weight loss. Has tried some soft proteins and tolerating all food he has tried. Was doing 3 protein shakes per day and now scaling back since he is having more foods.  Has been cleared by surgeon to start back to the gym. Started back to work this past Saturday.   Surgery date: 08/04/2015 Surgery type: Sleeve Gastrectomy Start weight at [facility]: 395.3 lbs Weight today: 395.3 lbs  Weight change: 38 lbs Total weight loss: 38 lbs  Preferred Learning Style:   No preference indicated   Learning Readiness:   Ready  24-hr recall: B (AM): Premier Protein (30 g)  Snk (AM): none or cheese (7 g) L (PM): 1 slice [place] deli meat or protein shake (3.5-30 g) Snk (PM): Dannon light and fit or Triple zero yogurt or cheese (7-15 g) D (PM): 2 grilled shrimp or snapper or deli meat (3.5 g)  Snk (PM): none or SF jello  Fluid intake: 11-22 oz protein shake, water or crystal light (over 100 oz) Estimated total protein intake: 60-80 g  Medications: see list  Supplementation: taking  Using straws: No Drinking while eating: No Hair loss: No Carbonated beverages: No N/V/D/C: some diarrhea, not bad  Dumping syndrome: No  Recent physical activity:  Walking 30 minutes per day   Progress Towards Goal(s):  In progress.  Handouts given during visit include: Phase 3A: Soft, High Protein Diet Handout   Nutritional Diagnosis:  [place]-3.3 Overweight/obesity related to past poor dietary habits and physical inactivity as evidenced by patient w/ recent sleeve gastrectomy surgery following dietary guidelines for continued weight loss.    Intervention:  Nutrition counseling provided. The following the learning objectives were met by the patient:  Identifies Phase 3A (Soft,  High Proteins) Dietary Goals and will begin from 2 weeks post-operatively to 2 months post-operatively  Identifies appropriate sources of fluids and proteins   States protein recommendations and appropriate sources post-operatively  Identifies the need for appropriate texture modifications, mastication, and bite sizes when consuming solids  Identifies appropriate multivitamin and calcium sources post-operatively  Describes the need for physical activity post-operatively and will follow MD recommendations  States when to call healthcare provider regarding medication questions or post-operative complications   Teaching Method Utilized:  Visual Auditory Hands on  Barriers to learning/adherence to lifestyle change: none  Demonstrated degree of understanding via:  Teach Back   Monitoring/Evaluation:  Dietary intake, exercise,  and body weight. Follow up in 6 weeks for 2 month post-op visit.

## 2015-09-30 ENCOUNTER — Encounter: Payer: BLUE CROSS/BLUE SHIELD | Attending: Surgery | Admitting: Dietician

## 2015-09-30 ENCOUNTER — Encounter: Payer: Self-pay | Admitting: Dietician

## 2015-09-30 DIAGNOSIS — Z6841 Body Mass Index (BMI) 40.0 and over, adult: Secondary | ICD-10-CM | POA: Diagnosis not present

## 2015-09-30 NOTE — Progress Notes (Signed)
  Follow-up visit: 8 Weeks Post-Operative Sleeve Gastrectomy Surgery  Medical Nutrition Therapy:  Appt start time: 1405 end time:  1425.  Primary concerns today: Post-operative Bariatric Surgery Nutrition Management. Returns with a 41 lb weight loss. No issues tolerating any foods. Recently not liking the taste of eggs. Feeling great overall.   Surgery date: 08/04/2015 Surgery type: Sleeve Gastrectomy [facility] Weight today:316.8 lbs   Weight change: 41 lbs Total weight loss: 78.5 lbs  TANITA  BODY COMP RESULTS  08/18/15 09/30/15   BMI (kg/m^2) 43.5 38.5   Fat Mass (lbs) 171.5 121.6   Fat Free Mass (lbs) 186 194.4   Total Body Water (lbs) 136 143.8    Preferred Learning Style:   No preference indicated   Learning Readiness:   Ready  24-hr recall: B (AM): Premier protein (30 g) Snk (AM): none  L (PM): 3 oz deli meat or grilled chicken (21 g) Snk (PM): none or SF jell or pudding D (PM): 3-4 oz deli meat or grilled chicken (21-28 g) Snk (PM): none  Fluid intake: water (around 100 oz) Estimated total protein intake: 72-79 g  Medications: see list  Supplementation: taking  Using straws: No Drinking while eating: No or sip  Hair loss: No Carbonated beverages: No N/V/D/C: No Dumping syndrome: No  Recent physical activity:  Cardio - treadmill or ran in the neighborhood and crunches/dumbell 3-4 x week   Progress Towards Goal(s):  In progress.  Handouts given during visit include:  Phase 3B High Protein + Non Starchy Vegetables   Nutritional Diagnosis:  [place]-3.3 Overweight/obesity related to past poor dietary habits and physical inactivity as evidenced by patient w/ recent sleeve gastrectomy surgery following dietary guidelines for continued weight loss.    Intervention:  Nutrition counseling provided. Goals:  Follow Phase 3B: High Protein + Non-Starchy Vegetables  Eat 3-6 small meals/snacks, every 3-5 hrs  Increase lean protein foods to  meet 80g goal  Increase fluid intake to 64oz +  Avoid drinking 15 minutes before, during and 30 minutes after eating  Aim for >30 min of physical activity daily  Teaching Method Utilized:  Visual Auditory Hands on  Barriers to learning/adherence to lifestyle change: none  Demonstrated degree of understanding via:  Teach Back   Monitoring/Evaluation:  Dietary intake, exercise, and body weight. Follow up in 1 months for 3 month post-op visit.

## 2015-09-30 NOTE — Patient Instructions (Signed)
Goals:  Follow Phase 3B: High Protein + Non-Starchy Vegetables  Eat 3-6 small meals/snacks, every 3-5 hrs  Increase lean protein foods to meet 80g goal  Increase fluid intake to 64oz +  Avoid drinking 15 minutes before, during and 30 minutes after eating  Aim for >30 min of physical activity daily  Surgery date: 08/04/2015 Surgery type: Sleeve Gastrectomy [facility] Weight today:316.8 lbs   Weight change: 41 lbs Total weight loss: 78.5 lbs  TANITA  BODY COMP RESULTS  08/18/15 09/30/15   BMI (kg/m^2) 43.5 38.5   Fat Mass (lbs) 171.5 121.6   Fat Free Mass (lbs) 186 194.4   Total Body Water (lbs) 136 143.8

## 2015-11-05 ENCOUNTER — Ambulatory Visit: Payer: BLUE CROSS/BLUE SHIELD | Admitting: Dietician

## 2015-11-25 ENCOUNTER — Encounter: Payer: Self-pay | Admitting: Dietician

## 2015-11-25 ENCOUNTER — Encounter: Payer: BLUE CROSS/BLUE SHIELD | Attending: General Surgery | Admitting: Dietician

## 2015-11-25 DIAGNOSIS — Z6841 Body Mass Index (BMI) 40.0 and over, adult: Secondary | ICD-10-CM | POA: Diagnosis not present

## 2015-11-25 NOTE — Progress Notes (Signed)
  Follow-up visit: 4 Months Post-Operative Sleeve Gastrectomy Surgery  Medical Nutrition Therapy:  Appt start time: 1435 end time:  1515  Primary concerns today: Post-operative Bariatric Surgery Nutrition Management. Returns with a 27.4 lb weight loss. No issues tolerating any foods. Feeling more hungry lately. 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    Recently not liking the taste of eggs. Feeling great overall.   Surgery date: 08/04/2015 Surgery type: Sleeve Gastrectomy [facility] Weight today:289.4 lbs    Weight change: 27.4 lbs, 33.4 lbs fat mass lost Total weight loss: 105.9 lbs  TANITA  BODY COMP RESULTS  08/18/15 09/30/15 11/25/15   BMI (kg/m^2) 43.5 38.5 35.2   Fat Mass (lbs) 171.5 121.6 88.2   Fat Free Mass (lbs) 186 194.4 201.2   Total Body Water (lbs) 136 143.8 139.6    Preferred Learning Style:   No preference indicated   Learning Readiness:   Ready  24-hr recall: B (AM): 1-2 x week bacon and eggs or Premier protein (10-30 g) or nothing 1-2 x week if gets up late Snk (AM): cliff bar or banana (0-11g) L (PM): 3-4 oz deli meat sometimes with cheese or grilled chicken (21-28 g) Snk (PM): none or cheese cubes/lunch meat or cliff bar (0-10 g) D (PM): 3-4 oz deli meat or grilled chicken with vegetables (21-28 g) Snk (PM): none   Fluid intake: water and unsweet tea (around 100 oz) Estimated total protein intake: 53- 67g  Medications: see list  Supplementation: not taking consistently, had 2 B12 shots  Using straws: No Drinking while eating: No  Hair loss: No Carbonated beverages: No N/V/D/C: No Dumping syndrome: No  Recent physical activity:  Cardio - treadmill or ran in the neighborhood and crunches/dumbell 3-4 x week, walking more on the golf course   Progress Towards Goal(s):  In progress.  Handouts given during visit include:  High Protein Snacks   Nutritional Diagnosis:  [place]-3.3 Overweight/obesity  related to past poor dietary habits and physical inactivity as evidenced by patient w/ recent sleeve gastrectomy surgery following dietary guidelines for continued weight loss.    Intervention:  Nutrition counseling provided. Goals:  Follow Phase 3B: High Protein + Non-Starchy Vegetables  Eat 3-6 small meals/snacks, every 3-5 hrs  Increase lean protein foods to meet 80g goal  Increase fluid intake to 64oz +  Avoid drinking 15 minutes before, during and 30 minutes after eating  Aim for >30 min of physical activity daily  Take a complete adult multivitamin to swallow (2 per day)  Citrical Petites Calcium (400 mg, 3 x day)  Set reminders in phone to take vitamins  Teaching Method Utilized:  Visual Auditory Hands on  Barriers to learning/adherence to lifestyle change: none  Demonstrated degree of understanding via:  Teach Back   Monitoring/Evaluation:  Dietary intake, exercise, and body weight. Follow up in 2 months for 6 month post-op visit.

## 2015-11-25 NOTE — Patient Instructions (Addendum)
Goals:  Follow Phase 3B: High Protein + Non-Starchy Vegetables  Eat 3-6 small meals/snacks, every 3-5 hrs  Increase lean protein foods to meet 80g goal  Increase fluid intake to 64oz +  Avoid drinking 15 minutes before, during and 30 minutes after eating  Aim for >30 min of physical activity daily  Take a complete adult multivitamin to swallow (2 per day)  Citrical Petites Calcium (400 mg, 3 x day)  Set reminders in phone to take vitamins   Surgery date: 08/04/2015 Surgery type: Sleeve Gastrectomy [facility] Weight today:289.4 lbs    Weight change: 27.4 lbs, 33.4 lbs fat mass lost Total weight loss: 105.9 lbs  TANITA  BODY COMP RESULTS  08/18/15 09/30/15 11/25/15   BMI (kg/m^2) 43.5 38.5 35.2   Fat Mass (lbs) 171.5 121.6 88.2   Fat Free Mass (lbs) 186 194.4 201.2   Total Body Water (lbs) 136 143.8 139.6

## 2016-01-26 ENCOUNTER — Ambulatory Visit: Payer: BLUE CROSS/BLUE SHIELD | Admitting: Dietician

## 2016-02-04 ENCOUNTER — Encounter: Payer: BLUE CROSS/BLUE SHIELD | Attending: General Surgery | Admitting: Dietician

## 2016-02-04 DIAGNOSIS — Z6841 Body Mass Index (BMI) 40.0 and over, adult: Secondary | ICD-10-CM | POA: Insufficient documentation

## 2016-02-04 NOTE — Patient Instructions (Addendum)
Goals:  Follow Phase 3B: High Protein + Non-Starchy Vegetables  Eat 3-6 small meals/snacks, every 3-5 hrs  Increase lean protein foods to meet 80g goal  Increase fluid intake to 64oz +  Avoid drinking 15 minutes before, during and 30 minutes after eating  Aim for >30 min of physical activity daily  Try Quest Protein Chips (use them to scoop chicken salad or egg salad)   Surgery date: 08/04/2015 Surgery type: Sleeve Gastrectomy [facility] Weight today:276.6 lbs   lbs  Weight change: 12.8 lbs Total weight loss: 118.7 lbs  TANITA  BODY COMP RESULTS  08/18/15 09/30/15 11/25/15 02/04/16   BMI (kg/m^2) 43.5 38.5 35.2 33.7   Fat Mass (lbs) 171.5 121.6 88.2 82.2   Fat Free Mass (lbs) 186 194.4 201.2 194.4   Total Body Water (lbs) 136 143.8 139.6 134.4

## 2016-02-04 NOTE — Progress Notes (Signed)
  Follow-up visit: 6 Months Post-Operative Sleeve Gastrectomy Surgery  Medical Nutrition Therapy:  Appt start time: 1515 end time:  1535  Primary concerns today: Post-operative Bariatric Surgery Nutrition Management. Returns with a 12.8lb weight loss. Still having some trouble tolerating eggs (having some loose stools). 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 Feeling more hungry between meals and able to eat a little more.   No issues tolerating any foods. Feeling more hungry lately. 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   Not craving any other foods now.   Surgery date: 08/04/2015 Surgery type: Sleeve Gastrectomy [facility] Weight today:276.6 lbs   lbs  Weight change: 12.8 lbs Total weight loss: 118.7 lbs  Weight loss goal: 200-220 lbs  TANITA  BODY COMP RESULTS  08/18/15 09/30/15 11/25/15 02/04/16   BMI (kg/m^2) 43.5 38.5 35.2 33.7   Fat Mass (lbs) 171.5 121.6 88.2 82.2   Fat Free Mass (lbs) 186 194.4 201.2 194.4   Total Body Water (lbs) 136 143.8 139.6 134.4    Preferred Learning Style:   No preference indicated   Learning Readiness:   Ready  24-hr recall: B (AM): 1-2 x week [place] bacon and 2 eggs or Premier protein (14-30 g)  Snk (AM):  None or Sting cheese or pure protein bar (0-22g) L (PM): 4 oz deli meat/grilled chicken sometimes with cheese or grilled chicken (28 g) Snk (PM): None or Sting cheese or pure protein bar (0-22g) D (PM): 3-4 oz deli meat or grilled chicken with vegetables (21-28 g) Snk (PM): none   Fluid intake: water and unsweet tea and G2 (around 100 oz) Estimated total protein intake: usually at least 80g  Medications: see list  Supplementation: taking and doing sublingual B12 now  Using straws: No Drinking while eating: No  Hair loss: No Carbonated beverages: No N/V/D/C: No Dumping syndrome: No  Recent physical activity:  Cardio -  treadmill or ran in the neighborhood and crunches/dumbell 1-2x week, walking more on the golf course   Progress Towards Goal(s):  In progress.  Handouts given during visit include:  none   Nutritional Diagnosis:  [place]-3.3 Overweight/obesity related to past poor dietary habits and physical inactivity as evidenced by patient w/ recent sleeve gastrectomy surgery following dietary guidelines for continued weight loss.    Intervention:  Nutrition counseling provided. Goals:  Follow Phase 3B: High Protein + Non-Starchy Vegetables  Eat 3-6 small meals/snacks, every 3-5 hrs  Increase lean protein foods to meet 80g goal  Increase fluid intake to 64oz +  Avoid drinking 15 minutes before, during and 30 minutes after eating  Aim for >30 min of physical activity daily  Try Quest Protein Chips (use them to scoop chicken salad or egg salad)  Teaching Method Utilized:  Visual Auditory Hands on  Barriers to learning/adherence to lifestyle change: none  Demonstrated degree of understanding via:  Teach Back   Monitoring/Evaluation:  Dietary intake, exercise, and body weight. Follow up in 2 months for 8 month post-op visit.

## 2016-03-16 DIAGNOSIS — Z23 Encounter for immunization: Secondary | ICD-10-CM | POA: Diagnosis not present

## 2016-03-21 ENCOUNTER — Encounter: Payer: Self-pay | Admitting: Nurse Practitioner

## 2016-03-21 ENCOUNTER — Ambulatory Visit (INDEPENDENT_AMBULATORY_CARE_PROVIDER_SITE_OTHER): Payer: BLUE CROSS/BLUE SHIELD | Admitting: Nurse Practitioner

## 2016-03-21 VITALS — BP 128/86 | Temp 98.6°F | Ht 76.0 in | Wt 275.0 lb

## 2016-03-21 DIAGNOSIS — J029 Acute pharyngitis, unspecified: Secondary | ICD-10-CM

## 2016-03-21 LAB — POCT RAPID STREP A (OFFICE): RAPID STREP A SCREEN: NEGATIVE

## 2016-03-21 NOTE — Progress Notes (Signed)
Subjective:  Presents for c/o sore throat that began yesterday evening. No fever, cough, headache or ear pain. No rash. No V/D or abd pain. Taking fluids well. Voiding nl.   Objective:   BP 128/86   Temp 98.6 F (37 C) (Oral)   Ht 6\' 4"  (1.93 m)   Wt 275 lb (124.7 kg)   BMI 33.47 kg/m  NAD. Alert, oriented. TMs retracted, no erythema. Pharynx: mild erythema posterior pharynx with slight PND noted. No exudate or lesions.Neck supple with mild anterior adenopathy. Lungs clear. Heart RRR.  Assessment: Acute pharyngitis, unspecified etiology - Plan: POCT rapid strep A, Strep A DNA probe  Plan: reviewed symptomatic care and warning signs. Throat culture pending. Call back sooner if needed.

## 2016-03-22 LAB — STREP A DNA PROBE: Strep Gp A Direct, DNA Probe: NEGATIVE

## 2016-04-07 ENCOUNTER — Encounter: Payer: BLUE CROSS/BLUE SHIELD | Attending: General Surgery | Admitting: Dietician

## 2016-04-07 DIAGNOSIS — Z6841 Body Mass Index (BMI) 40.0 and over, adult: Secondary | ICD-10-CM | POA: Insufficient documentation

## 2016-04-07 NOTE — Patient Instructions (Addendum)
Goals:  Follow Phase 3B: High Protein + Non-Starchy Vegetables  Eat 3-6 small meals/snacks, every 3-5 hrs  Increase lean protein foods to meet 80g goal  Increase fluid intake to 64oz +  Avoid drinking 15 minutes before, during and 30 minutes after eating  Aim for >30 min of physical activity daily  Try Quest Protein Chips (use them to scoop chicken salad or egg salad) ([employer] or [employer])   Surgery date: 08/04/2015 Surgery type: Sleeve Gastrectomy [facility]: [facility] Weight today: 273.8 lbs  Weight change: 3 lbs Total weight loss: 121.7 lbs  TANITA  BODY COMP RESULTS  08/18/15 09/30/15 11/25/15 02/04/16 04/07/16   BMI (kg/m^2) 43.5 38.5 35.2 33.7 33.3   Fat Mass (lbs) 171.5 121.6 88.2 82.2 76.4   Fat Free Mass (lbs) 186 194.4 201.2 194.4 197.4   Total Body Water (lbs) 136 143.8 139.6 134.4 135.2

## 2016-04-07 NOTE — Progress Notes (Signed)
  Follow-up visit: 8 Months Post-Operative Sleeve Gastrectomy Surgery  Medical Nutrition Therapy:  Appt start time: 1445 end time:  1500  Primary concerns today: Post-operative Bariatric Surgery Nutrition Management. Returns with a 3 lb weight loss. Feels like heeds to get back in the gym more often. Has been going about 1-2 x week. Diet is still the same. 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  Splurged with Thanksgiving foods.   Can eat more than he used to. Eating when hungry.   Surgery date: 08/04/2015 Surgery type: Sleeve Gastrectomy [facility]: [facility] Weight today: 273.8 lbs  Weight change: 3 lbs Total weight loss: 121.7 lbs  Weight loss goal: 200-230 lbs  TANITA  BODY COMP RESULTS  08/18/15 09/30/15 11/25/15 02/04/16 04/07/16   BMI (kg/m^2) 43.5 38.5 35.2 33.7 33.3   Fat Mass (lbs) 171.5 121.6 88.2 82.2 76.4   Fat Free Mass (lbs) 186 194.4 201.2 194.4 197.4   Total Body Water (lbs) 136 143.8 139.6 134.4 135.2    Preferred Learning Style:   No preference indicated   Learning Readiness:   Ready  24-hr recall: B (AM): egg mcmuffin sometimes or 1-2 x week Malaw[place]urkey bacon and 2 eggs (14 g) Snk (AM):  P3 pack or protein shake 4 x week or yogurt (12-30g) L (PM): 4 oz deli meat/grilled chicken sometimes with cheese or grilled chicken (28 g) Snk (PM): None or cheese/nuts (0-5g) D (PM): 4-5 oz deli meat or grilled chicken with vegetables (28-35 g) Snk (PM): none   Fluid intake: water and unsweet tea and propel zero (around 100 oz) Estimated total protein intake: usually at least 80g  Medications: see list  Supplementation: taking and doing sublingual B12 now  Using straws: No Drinking while eating: No  Hair loss: No Carbonated beverages: No N/V/D/C: No Dumping syndrome: No  Recent physical activity:  Recently 4 x week Cardio - treadmill or ran in the neighborhood and crunches/dumbell  Progress Towards Goal(s):  In progress.  Handouts given during visit  include:  none   Nutritional Diagnosis:  [place]-3.3 Overweight/obesity related to past poor dietary habits and physical inactivity as evidenced by patient w/ recent sleeve gastrectomy surgery following dietary guidelines for continued weight loss.    Intervention:  Nutrition counseling provided. Goals:  Follow Phase 3B: High Protein + Non-Starchy Vegetables  Eat 3-6 small meals/snacks, every 3-5 hrs  Increase lean protein foods to meet 80g goal  Increase fluid intake to 64oz +  Avoid drinking 15 minutes before, during and 30 minutes after eating  Aim for >30 min of physical activity daily  Try Quest Protein Chips (use them to scoop chicken salad or egg salad)  Teaching Method Utilized:  Visual Auditory Hands on  Barriers to learning/adherence to lifestyle change: none  Demonstrated degree of understanding via:  Teach Back   Monitoring/Evaluation:  Dietary intake, exercise, and body weight. Follow up in 2 months for 10 month post-op visit.

## 2016-05-10 ENCOUNTER — Ambulatory Visit (INDEPENDENT_AMBULATORY_CARE_PROVIDER_SITE_OTHER): Payer: BLUE CROSS/BLUE SHIELD | Admitting: Family Medicine

## 2016-05-10 ENCOUNTER — Encounter: Payer: Self-pay | Admitting: Family Medicine

## 2016-05-10 VITALS — BP 116/72 | Ht 76.0 in | Wt 267.2 lb

## 2016-05-10 DIAGNOSIS — J019 Acute sinusitis, unspecified: Secondary | ICD-10-CM | POA: Diagnosis not present

## 2016-05-10 DIAGNOSIS — B9689 Other specified bacterial agents as the cause of diseases classified elsewhere: Secondary | ICD-10-CM

## 2016-05-10 MED ORDER — CLARITHROMYCIN 500 MG PO TABS: 500.0000 mg | ORAL_TABLET | Freq: Two times a day (BID) | ORAL | 0 refills | Status: AC

## 2016-05-10 NOTE — Progress Notes (Signed)
   Subjective:    Patient ID: Vincent Solis, male    DOB: 1995-07-06, 21 y.o.   MRN: [id number]  Cough  This is a new problem. The current episode started in the past 7 days. Associated symptoms include ear pain, headaches, nasal congestion and a sore throat. Treatments tried: otc advil cold.   Had a bad sore throat thru the wekebnd  Pos prod green phlegm  Bad cough  Chills ane sweats  Headche mostly frontal, comes and goes, dep ache, no aching elsewheen  advil prn,   Exposed to friend with resp illness     Review of Systems  HENT: Positive for ear pain and sore throat.   Respiratory: Positive for cough.   Neurological: Positive for headaches.       Objective:   Physical Exam Alert, mild malaise. Hydration good Vitals stable. frontal/ maxillary tenderness evident positive nasal congestion. pharynx normal neck supple  lungs clear/no crackles or wheezes. heart regular in rhythm        Assessment & Plan:  Impression rhinosinusitis likely post viral, discussed with patient. plan antibiotics prescribed. Questions answered. Symptomatic care discussed. warning signs discussed. WSLSubstantial discussion had about patient's bariatric surgery. Overall big success. Handling it well. Has lost well over 100 pounds. Encouraged to maintain long-term diet and exercise habits. No longer on blood pressure medication either

## 2016-05-13 ENCOUNTER — Telehealth: Payer: Self-pay | Admitting: Family Medicine

## 2016-05-13 NOTE — Telephone Encounter (Signed)
Pt father called stating that the pt has had some right sided facial pain/swelling since starting the antibiotics. He states that OTC meds have helped with the pain. He denies any difficulty breathing or swallowing. I told father that we would be glad to see pt although it does not sound like an allergic reaction. The father said he did not feel that the patient needed to come in. I advised that he take pt to the ER if the swelling progresses or if the pt has difficulty breathing or swallowing he needs to go directly to the emergency room. Father voiced understanding.

## 2016-05-24 ENCOUNTER — Encounter (HOSPITAL_COMMUNITY): Payer: Self-pay

## 2016-06-07 ENCOUNTER — Ambulatory Visit: Payer: BLUE CROSS/BLUE SHIELD | Admitting: Skilled Nursing Facility1

## 2016-06-09 ENCOUNTER — Encounter: Payer: BLUE CROSS/BLUE SHIELD | Attending: General Surgery | Admitting: Skilled Nursing Facility1

## 2016-06-09 ENCOUNTER — Encounter: Payer: Self-pay | Admitting: Skilled Nursing Facility1

## 2016-06-09 DIAGNOSIS — Z6841 Body Mass Index (BMI) 40.0 and over, adult: Secondary | ICD-10-CM | POA: Insufficient documentation

## 2016-06-09 NOTE — Progress Notes (Signed)
  Follow-up visit: 8 Months Post-Operative Sleeve Gastrectomy Surgery  Medical Nutrition Therapy:  Appt start time: 1445 end time:  1500  Primary concerns today: Post-operative Bariatric Surgery Nutrition Management. Returns with a 3 lb weight loss. Feels like heeds to get back in the gym more often. Has been going about 1-2 x week. Diet is still the same. 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  Pt states Slower lately trying to go to the gym 3-4 times a week; 30 minutes cardio and wts.    Can eat more than he used to. Eating when hungry.   Surgery date: 08/04/2015 Surgery type: Sleeve Gastrectomy [facility]: [facility] Weight today: 273.8 lbs  Weight change: 3 lbs Total weight loss: 121.7 lbs  Weight loss goal: 200-230 lbs  TANITA  BODY COMP RESULTS  08/18/15 09/30/15 11/25/15 02/04/16 04/07/16 06/09/2016   BMI (kg/m^2) 43.5 38.5 35.2 33.7 33.3 32.3   Fat Mass (lbs) 171.5 121.6 88.2 82.2 76.4 76.8   Fat Free Mass (lbs) 186 194.4 201.2 194.4 197.4 188.8   Total Body Water (lbs) 136 143.8 139.6 134.4 135.2 130    Preferred Learning Style:   No preference indicated   Learning Readiness:   Ready  24-hr recall: B (AM): pure protein bar Snk (AM):  nutrigran bar or cheese L (PM): 4 oz deli meat/grilled chicken sometimes with cheese or grilled chicken (28 g) Snk (PM): None or cheese/nuts (0-5g) D (PM): 4-5 oz deli meat or grilled chicken with vegetables (28-35 g) Snk (PM): none   Fluid intake: water and unsweet tea and propel zero (around 100 oz) Estimated total protein intake: usually at least 80g  Medications: see list  Supplementation: taking and doing sublingual B12 now  Using straws: No Drinking while eating: No  Hair loss: No Carbonated beverages: No N/V/D/C: No Dumping syndrome: No  Recent physical activity:  Recently 4 x week Cardio - treadmill or ran in the neighborhood and crunches/dumbell  Progress Towards Goal(s):  In progress.  Handouts given during  visit include:  none   Nutritional Diagnosis:  [place]-3.3 Overweight/obesity related to past poor dietary habits and physical inactivity as evidenced by patient w/ recent sleeve gastrectomy surgery following dietary guidelines for continued weight loss.    Intervention:  Nutrition counseling provided. Goals:  Follow Phase 3B: High Protein + Non-Starchy Vegetables  Eat 3-6 small meals/snacks, every 3-5 hrs  Increase lean protein foods to meet 80g goal  Increase fluid intake to 64oz +  Avoid drinking 15 minutes before, during and 30 minutes after eating  Aim for >30 min of physical activity daily  Try Quest Protein Chips (use them to scoop chicken salad or egg salad)  Teaching Method Utilized:  Visual Auditory Hands on  Barriers to learning/adherence to lifestyle change: none  Demonstrated degree of understanding via:  Teach Back   Monitoring/Evaluation:  Dietary intake, exercise, and body weight. Follow up in 2 months for 10 month post-op visit.

## 2016-07-22 DIAGNOSIS — Z9884 Bariatric surgery status: Secondary | ICD-10-CM | POA: Diagnosis not present

## 2016-09-08 ENCOUNTER — Ambulatory Visit: Payer: BLUE CROSS/BLUE SHIELD | Admitting: Skilled Nursing Facility1

## 2016-09-26 ENCOUNTER — Encounter: Payer: BLUE CROSS/BLUE SHIELD | Attending: Surgery | Admitting: Registered"

## 2016-09-26 ENCOUNTER — Encounter: Payer: Self-pay | Admitting: Registered"

## 2016-09-26 DIAGNOSIS — Z713 Dietary counseling and surveillance: Secondary | ICD-10-CM | POA: Insufficient documentation

## 2016-09-26 DIAGNOSIS — E669 Obesity, unspecified: Secondary | ICD-10-CM | POA: Insufficient documentation

## 2016-09-26 NOTE — Patient Instructions (Addendum)
-   Increase physical activity this summer while out of school.   - Create daily schedule to include activity prior to school starting in the Aug/Sept.

## 2016-09-26 NOTE — Progress Notes (Signed)
Follow-up visit: 13 Months Post-Operative Sleeve Gastrectomy Surgery  Medical Nutrition Therapy:  Appt start time: 9:40 end time:  9:55  Primary concerns today: Post-operative Bariatric Surgery Nutrition Management. Pt returns and did not want to weigh today. Pt states he has been weighing at home and is aware what he needs to do. Pt states within the past month his schedule has been off due to finals in school. Pt states when he works out 1-2 times per week, his weight loss is slow. Pt states he knows he needs to work out about 4-5 times a week to see the results he wants. Pt states his diet is the same. 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   Pt states Slower lately trying to go to the gym 3-4 times a week; 30 minutes cardio and wts.     Surgery date: 08/04/2015 Surgery type: Sleeve Gastrectomy [facility] Weight today: Pt declined weight today  Weight change: 3 lbs Total weight loss: 121.7 lbs  Weight loss goal: 200-230 lbs  TANITA  BODY COMP RESULTS  08/18/15 09/30/15 11/25/15 02/04/16 04/07/16 06/09/2016 09/26/2016   BMI (kg/m^2) 43.5 38.5 35.2 33.7 33.3 32.3 N/A   Fat Mass (lbs) 171.5 121.6 88.2 82.2 76.4 76.8    Fat Free Mass (lbs) 186 194.4 201.2 194.4 197.4 188.8    Total Body Water (lbs) 136 143.8 139.6 134.4 135.2 130     Preferred Learning Style:   No preference indicated   Learning Readiness:   Ready  24-hr recall:  B (AM): pure protein bar(20g) or premier protein shake (30g) Snk (AM):  nutrigran bar or cheese L (PM): 4 oz deli meat/grilled chicken sometimes with cheese or grilled chicken (28 g) Snk (PM): None or cheese/nuts (0-5g) D (PM): 4-5 oz deli meat or grilled chicken with vegetables (28-35 g) Snk (PM): none   Fluid intake: water and unsweet tea and propel zero (around 100 oz) Estimated total protein  intake: usually at least 80g  Medications: see list  Supplementation: taking and doing sublingual B12 now  Using straws: No Drinking while eating: No  Hair loss: No Carbonated beverages: No N/V/D/C: No Dumping syndrome: No  Recent physical activity:  Recently 4 x week Cardio - treadmill or ran in the neighborhood and crunches/dumbell  Progress Towards Goal(s):  In progress.  Handouts given during visit include:  none   Nutritional Diagnosis:  [place]-3.3 Overweight/obesity related to past poor dietary habits and physical inactivity as evidenced by patient w/ recent sleeve gastrectomy surgery following dietary guidelines for continued weight loss.    Intervention:  Nutrition counseling provided. Goals:  Follow Phase 3B: High Protein + Non-Starchy Vegetables  Eat 3-6 small meals/snacks, every 3-5 hrs  Increase lean protein foods to meet 80g goal  Increase fluid intake to 64oz +  Avoid drinking 15 minutes before, during and 30 minutes after eating  Aim for >30 min of physical activity daily  Try Quest Protein Chips (use them to scoop chicken salad or egg salad)  Increase physical activity this summer while out of school   Create daily schedule to include activity prior to school starting in the Aug/Sept.   Teaching Method Utilized:  Visual Auditory Hands on  Barriers to learning/adherence to lifestyle change: none  Demonstrated degree of understanding via:  Teach Back   Monitoring/Evaluation:  Dietary intake, exercise, and  body weight. Follow up in 3 months for 16 month post-op visit.

## 2016-09-30 ENCOUNTER — Telehealth: Payer: Self-pay | Admitting: Family Medicine

## 2016-09-30 NOTE — Telephone Encounter (Signed)
Dad dropped off an immunization form to be signed. Form is in nurse box.

## 2016-12-27 ENCOUNTER — Ambulatory Visit: Payer: BLUE CROSS/BLUE SHIELD | Admitting: Registered"

## 2017-01-17 ENCOUNTER — Ambulatory Visit: Payer: BLUE CROSS/BLUE SHIELD | Admitting: Registered"

## 2017-02-02 ENCOUNTER — Ambulatory Visit: Payer: BLUE CROSS/BLUE SHIELD | Admitting: Registered"

## 2017-03-13 ENCOUNTER — Encounter: Payer: Self-pay | Admitting: Registered"

## 2017-03-13 ENCOUNTER — Encounter: Payer: BLUE CROSS/BLUE SHIELD | Attending: Surgery | Admitting: Registered"

## 2017-03-13 DIAGNOSIS — E669 Obesity, unspecified: Secondary | ICD-10-CM

## 2017-03-13 DIAGNOSIS — Z9884 Bariatric surgery status: Secondary | ICD-10-CM | POA: Diagnosis not present

## 2017-03-13 DIAGNOSIS — Z713 Dietary counseling and surveillance: Secondary | ICD-10-CM | POA: Insufficient documentation

## 2017-03-13 NOTE — Patient Instructions (Addendum)
-   Aim to plan and pack meals especially on Sunday and Thursday nights.   - Snacks such nuts, fruit, vegetables, salads, etc to keep on hand during day.   - Aim to not skip meals.  - Set mealtime reminders for busy days and weekends using Baritastic App.

## 2017-03-13 NOTE — Progress Notes (Signed)
  Follow-up visit: 19 Months Post-Operative Sleeve Gastrectomy Surgery  Medical Nutrition Therapy:  Appt start time: 4:08 end time:  4:35  Primary concerns today: Post-operative Bariatric Surgery Nutrition Management.  Pt states he needs cooking ideas. Pt states he got off track when moving on his own and not having meals provided for him. 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 Pt states he has been snacking more lately, rather than eating meals. Pt states he sometimes skips meals when he is busy or picks up inexpensive fast food. Pt states he has not been working out as much as he would like.   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 Pt states he [facility] [employer] [facility] but prefers to utilize the gym at his apartment complex. Pt states he is trying to go to the gym 3-4 times a week; 30 minutes cardio and wts.     Surgery date: 08/04/2015 Surgery type: Sleeve Gastrectomy [facility]: [facility] Weight today: 289.6 lbs Weight change: N/A Total weight loss: 105.7 lbs  Weight loss goal: 200-230 lbs  TANITA  BODY COMP RESULTS  08/18/15 09/30/15 11/25/15 02/04/16 04/07/16 06/09/2016 09/26/2016 03/13/2017   BMI (kg/m^2) 43.5 38.5 35.2 33.7 33.3 32.3 N/A 35.3   Fat Mass (lbs) 171.5 121.6 88.2 82.2 76.4 76.8  93.0   Fat Free Mass (lbs) 186 194.4 201.2 194.4 197.4 188.8  196.6   Total Body Water (lbs) 136 143.8 139.6 134.4 135.2 130  137.8    Preferred Learning Style:   No preference indicated   Learning Readiness:   Ready  24-hr recall:  B (AM): 1-2 eggs (12g), english muffin or pure protein bar (20g) or premier protein shake (30g) Snk (AM): sometimes nutrigran bar or cheese (7g) L (PM): sometimes skips; 1/2 Malaw[place]urkey sandwich (14g), wheat bread Snk (PM): cheese/nuts (0-5g), granola bar D (PM): 5-6 oz grilled chicken sandwich (42g), baked beans  with vegetables (28-35 g) Snk (PM): none   Fluid intake: water and unsweet tea and propel zero (around 100 oz) Estimated total protein intake: usually at least 80g  Medications: see list  Supplementation: Bariatric Advantage and 3 calcium chews t  Using straws: No Drinking while eating: No  Hair loss: No Carbonated beverages: No N/V/D/C/Gas: No, no, no, no, no Dumping syndrome: No  Recent physical activity: 30-45 min cardio, strength training 3-4x/week  Progress Towards Goal(s):  In progress.  Handouts given during visit include:  none   Nutritional Diagnosis:  [place]-3.3 Overweight/obesity related to past poor dietary habits and physical inactivity as evidenced by patient w/ recent sleeve gastrectomy surgery following dietary guidelines for continued weight loss.    Intervention:  Nutrition counseling provided. Goals: - Aim to plan and pack meals especially on Sunday and Thursday nights.  - Snacks such nuts, fruit, vegetables, salads, etc to keep on hand during day.  - Aim to not skip meals. - Set mealtime reminders for busy days and weekends using Baritastic App.   Teaching Method Utilized:  Visual Auditory Hands on  Barriers to learning/adherence to lifestyle change: work-life balance  Demonstrated degree of understanding via:  Teach Back   Monitoring/Evaluation:  Dietary intake, exercise, and body weight. Follow up in 2 months for 21 month post-op visit.

## 2017-05-18 ENCOUNTER — Ambulatory Visit: Payer: BLUE CROSS/BLUE SHIELD | Admitting: Registered"

## 2017-06-07 ENCOUNTER — Ambulatory Visit: Payer: BLUE CROSS/BLUE SHIELD | Admitting: Registered"

## 2018-01-27 DIAGNOSIS — S0990XA Unspecified injury of head, initial encounter: Secondary | ICD-10-CM | POA: Diagnosis not present

## 2018-01-27 DIAGNOSIS — S42022A Displaced fracture of shaft of left clavicle, initial encounter for closed fracture: Secondary | ICD-10-CM | POA: Diagnosis not present

## 2018-01-27 DIAGNOSIS — S299XXA Unspecified injury of thorax, initial encounter: Secondary | ICD-10-CM | POA: Diagnosis not present

## 2018-01-27 DIAGNOSIS — S42012A Anterior displaced fracture of sternal end of left clavicle, initial encounter for closed fracture: Secondary | ICD-10-CM | POA: Diagnosis not present

## 2018-01-27 DIAGNOSIS — S199XXA Unspecified injury of neck, initial encounter: Secondary | ICD-10-CM | POA: Diagnosis not present

## 2018-01-27 DIAGNOSIS — S3991XA Unspecified injury of abdomen, initial encounter: Secondary | ICD-10-CM | POA: Diagnosis not present

## 2018-01-27 DIAGNOSIS — S3993XA Unspecified injury of pelvis, initial encounter: Secondary | ICD-10-CM | POA: Diagnosis not present

## 2018-01-27 DIAGNOSIS — T07XXXA Unspecified multiple injuries, initial encounter: Secondary | ICD-10-CM | POA: Diagnosis not present

## 2018-01-29 DIAGNOSIS — M25531 Pain in right wrist: Secondary | ICD-10-CM | POA: Diagnosis not present

## 2018-01-29 DIAGNOSIS — M25512 Pain in left shoulder: Secondary | ICD-10-CM | POA: Diagnosis not present

## 2018-01-30 ENCOUNTER — Encounter: Payer: Self-pay | Admitting: Family Medicine

## 2018-01-31 ENCOUNTER — Other Ambulatory Visit: Payer: Self-pay | Admitting: *Deleted

## 2018-01-31 MED ORDER — ENALAPRIL MALEATE 5 MG PO TABS: 5.0000 mg | ORAL_TABLET | Freq: Every day | ORAL | 0 refills | Status: DC

## 2018-02-06 ENCOUNTER — Encounter: Payer: Self-pay | Admitting: Family Medicine

## 2018-02-06 ENCOUNTER — Ambulatory Visit: Payer: BLUE CROSS/BLUE SHIELD | Admitting: Family Medicine

## 2018-02-06 VITALS — BP 120/86 | Ht 76.0 in | Wt 291.0 lb

## 2018-02-06 DIAGNOSIS — S300XXA Contusion of lower back and pelvis, initial encounter: Secondary | ICD-10-CM

## 2018-02-06 DIAGNOSIS — I10 Essential (primary) hypertension: Secondary | ICD-10-CM

## 2018-02-06 DIAGNOSIS — J4520 Mild intermittent asthma, uncomplicated: Secondary | ICD-10-CM

## 2018-02-06 MED ORDER — ENALAPRIL MALEATE 5 MG PO TABS: 5.0000 mg | ORAL_TABLET | Freq: Every day | ORAL | 5 refills | Status: DC

## 2018-02-06 MED ORDER — ALBUTEROL SULFATE HFA 108 (90 BASE) MCG/ACT IN AERS: INHALATION_SPRAY | RESPIRATORY_TRACT | 0 refills | Status: DC
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
## 2018-02-07 DIAGNOSIS — X58XXXA Exposure to other specified factors, initial encounter: Secondary | ICD-10-CM | POA: Diagnosis not present

## 2018-02-07 DIAGNOSIS — S42022A Displaced fracture of shaft of left clavicle, initial encounter for closed fracture: Secondary | ICD-10-CM | POA: Diagnosis not present

## 2018-02-07 DIAGNOSIS — Y999 Unspecified external cause status: Secondary | ICD-10-CM | POA: Diagnosis not present

## 2018-02-07 DIAGNOSIS — S42032A Displaced fracture of lateral end of left clavicle, initial encounter for closed fracture: Secondary | ICD-10-CM | POA: Diagnosis not present

## 2018-02-15 DIAGNOSIS — M533 Sacrococcygeal disorders, not elsewhere classified: Secondary | ICD-10-CM | POA: Diagnosis not present

## 2018-02-15 DIAGNOSIS — S42032D Displaced fracture of lateral end of left clavicle, subsequent encounter for fracture with routine healing: Secondary | ICD-10-CM | POA: Diagnosis not present

## 2018-02-24 ENCOUNTER — Other Ambulatory Visit: Payer: Self-pay | Admitting: Family Medicine

## 2018-03-13 ENCOUNTER — Ambulatory Visit: Payer: BLUE CROSS/BLUE SHIELD | Admitting: Family Medicine

## 2018-03-13 ENCOUNTER — Encounter: Payer: Self-pay | Admitting: Family Medicine

## 2018-03-13 VITALS — BP 128/82 | Ht 76.0 in | Wt 296.4 lb

## 2018-03-13 DIAGNOSIS — R229 Localized swelling, mass and lump, unspecified: Secondary | ICD-10-CM | POA: Diagnosis not present

## 2018-03-13 DIAGNOSIS — I10 Essential (primary) hypertension: Secondary | ICD-10-CM | POA: Diagnosis not present

## 2018-03-13 DIAGNOSIS — T148XXA Other injury of unspecified body region, initial encounter: Secondary | ICD-10-CM
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
## 2018-03-13 NOTE — Patient Instructions (Signed)

## 2018-03-15 DIAGNOSIS — S42032D Displaced fracture of lateral end of left clavicle, subsequent encounter for fracture with routine healing: Secondary | ICD-10-CM | POA: Diagnosis not present

## 2018-03-26 DIAGNOSIS — M25512 Pain in left shoulder: Secondary | ICD-10-CM | POA: Diagnosis not present

## 2018-03-26 DIAGNOSIS — M6281 Muscle weakness (generalized): Secondary | ICD-10-CM | POA: Diagnosis not present

## 2018-03-26 DIAGNOSIS — M25612 Stiffness of left shoulder, not elsewhere classified: Secondary | ICD-10-CM | POA: Diagnosis not present

## 2018-03-29 DIAGNOSIS — M25512 Pain in left shoulder: Secondary | ICD-10-CM | POA: Diagnosis not present

## 2018-03-29 DIAGNOSIS — M25612 Stiffness of left shoulder, not elsewhere classified: Secondary | ICD-10-CM | POA: Diagnosis not present

## 2018-03-29 DIAGNOSIS — M6281 Muscle weakness (generalized): Secondary | ICD-10-CM | POA: Diagnosis not present

## 2018-04-02 DIAGNOSIS — M25512 Pain in left shoulder: Secondary | ICD-10-CM | POA: Diagnosis not present

## 2018-04-02 DIAGNOSIS — M6281 Muscle weakness (generalized): Secondary | ICD-10-CM | POA: Diagnosis not present

## 2018-04-02 DIAGNOSIS — M25612 Stiffness of left shoulder, not elsewhere classified: Secondary | ICD-10-CM | POA: Diagnosis not present

## 2018-04-10 DIAGNOSIS — M25512 Pain in left shoulder: Secondary | ICD-10-CM | POA: Diagnosis not present

## 2018-04-10 DIAGNOSIS — M6281 Muscle weakness (generalized): Secondary | ICD-10-CM | POA: Diagnosis not present

## 2018-04-10 DIAGNOSIS — M25612 Stiffness of left shoulder, not elsewhere classified: Secondary | ICD-10-CM | POA: Diagnosis not present

## 2018-04-24 DIAGNOSIS — M25512 Pain in left shoulder: Secondary | ICD-10-CM | POA: Diagnosis not present

## 2018-04-24 DIAGNOSIS — M25612 Stiffness of left shoulder, not elsewhere classified: Secondary | ICD-10-CM | POA: Diagnosis not present

## 2018-04-24 DIAGNOSIS — M6281 Muscle weakness (generalized): Secondary | ICD-10-CM | POA: Diagnosis not present

## 2018-04-26 DIAGNOSIS — M25512 Pain in left shoulder: Secondary | ICD-10-CM | POA: Diagnosis not present

## 2018-05-10 ENCOUNTER — Ambulatory Visit (INDEPENDENT_AMBULATORY_CARE_PROVIDER_SITE_OTHER): Payer: BLUE CROSS/BLUE SHIELD | Admitting: Family Medicine

## 2018-05-10 ENCOUNTER — Encounter: Payer: Self-pay | Admitting: Family Medicine

## 2018-05-10 VITALS — BP 140/86 | Ht 76.0 in | Wt 314.8 lb

## 2018-05-10 DIAGNOSIS — Z79899 Other long term (current) drug therapy: Secondary | ICD-10-CM

## 2018-05-10 DIAGNOSIS — I10 Essential (primary) hypertension: Secondary | ICD-10-CM | POA: Diagnosis not present

## 2018-05-10 DIAGNOSIS — Z0000 Encounter for general adult medical examination without abnormal findings: Secondary | ICD-10-CM

## 2018-05-10 MED ORDER — AMOXICILLIN 500 MG PO CAPS: 500.0000 mg | ORAL_CAPSULE | Freq: Three times a day (TID) | ORAL | 0 refills | Status: DC

## 2018-05-10 MED ORDER — ENALAPRIL MALEATE 10 MG PO TABS: 10.0000 mg | ORAL_TABLET | Freq: Every day | ORAL | 5 refills | Status: DC
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
## 2018-06-06 ENCOUNTER — Other Ambulatory Visit: Payer: Self-pay | Admitting: Family Medicine

## 2018-06-06 ENCOUNTER — Telehealth: Payer: Self-pay | Admitting: Family Medicine

## 2018-06-06 MED ORDER — CEFPROZIL 500 MG PO TABS: ORAL_TABLET | ORAL | 0 refills | Status: DC

## 2018-06-06 NOTE — Telephone Encounter (Signed)
cefzil 500 bid ten d 

## 2018-06-06 NOTE — Telephone Encounter (Signed)
Medication sent to pharmacy and pt is aware 

## 2018-06-06 NOTE — Telephone Encounter (Signed)
Please advise. Thank you

## 2018-06-06 NOTE — Telephone Encounter (Signed)
Pt was seen 05/10/18 he was prescribed amox. Once pt finished medication he felt better for a week and about 4 days ago he has been having the same symptoms again. Cough, congestion, ear and facial pressure. It has gotten worse over the past 2 days. No fever, body aches or chills.   If something is able to be called in please send to [id number] [place],  - [address]  CB# [phone].

## 2018-06-07 DIAGNOSIS — M25512 Pain in left shoulder: Secondary | ICD-10-CM | POA: Diagnosis not present

## 2018-06-08 ENCOUNTER — Encounter: Payer: Self-pay | Admitting: Family Medicine

## 2018-07-21 DIAGNOSIS — R252 Cramp and spasm: Secondary | ICD-10-CM | POA: Diagnosis not present

## 2018-07-21 DIAGNOSIS — R202 Paresthesia of skin: Secondary | ICD-10-CM | POA: Diagnosis not present

## 2018-07-21 DIAGNOSIS — R0602 Shortness of breath: Secondary | ICD-10-CM | POA: Diagnosis not present

## 2018-07-21 DIAGNOSIS — I10 Essential (primary) hypertension: Secondary | ICD-10-CM | POA: Diagnosis not present

## 2018-08-02 ENCOUNTER — Other Ambulatory Visit: Payer: Self-pay | Admitting: Family Medicine

## 2018-11-05 ENCOUNTER — Ambulatory Visit: Payer: BLUE CROSS/BLUE SHIELD | Admitting: Family Medicine

## 2018-11-16 DIAGNOSIS — S91331A Puncture wound without foreign body, right foot, initial encounter: Secondary | ICD-10-CM | POA: Diagnosis not present

## 2018-11-16 DIAGNOSIS — Z23 Encounter for immunization: Secondary | ICD-10-CM | POA: Diagnosis not present

## 2019-01-13 ENCOUNTER — Other Ambulatory Visit: Payer: Self-pay | Admitting: Family Medicine

## 2019-01-16 NOTE — Telephone Encounter (Signed)
Call pt virt or f to f visit then may ref times one mo

## 2019-01-16 NOTE — Telephone Encounter (Signed)
Left message to call office

## 2019-01-16 NOTE — Telephone Encounter (Signed)
Please schedule and then send back to nurses to send in refill 

## 2019-01-21 NOTE — Telephone Encounter (Signed)
Patient scheduled appointment for 9/15 for medication followup

## 2019-01-22 ENCOUNTER — Ambulatory Visit (INDEPENDENT_AMBULATORY_CARE_PROVIDER_SITE_OTHER): Payer: BC Managed Care – PPO | Admitting: Family Medicine

## 2019-01-22 ENCOUNTER — Encounter: Payer: Self-pay | Admitting: Family Medicine

## 2019-01-22 DIAGNOSIS — I10 Essential (primary) hypertension: Secondary | ICD-10-CM

## 2019-01-22 DIAGNOSIS — J4520 Mild intermittent asthma, uncomplicated: Secondary | ICD-10-CM

## 2019-01-22 MED ORDER — ALBUTEROL SULFATE HFA 108 (90 BASE) MCG/ACT IN AERS: INHALATION_SPRAY | RESPIRATORY_TRACT | 5 refills | Status: DC

## 2019-01-22 MED ORDER — ENALAPRIL MALEATE 10 MG PO TABS: 10.0000 mg | ORAL_TABLET | Freq: Every day | ORAL | 5 refills | Status: DC

## 2019-01-22 NOTE — Progress Notes (Signed)
   Subjective:  Audio plus video  Patient ID: Vincent Solis, male    DOB: [birth year], 23 y.o.   MRN: [id number]  Hypertension This is a chronic problem. Associated symptoms include headaches. There are no compliance problems.   Pt is taking Enalapril 10 mg take one tablet daily. Pt states he ran out of medications and has had a headache for about 2 days.   Virtual Visit via Video Note  I connected with Vincent Solis on 01/22/19 at  3:00 PM EDT by a video enabled telemedicine application and verified that I am speaking with the correct person using two identifiers.  Location: Patient: Unknown home Provider: Office   I discussed the limitations of evaluation and management by telemedicine and the availability of in person appointments. The patient expressed understanding and agreed to proceed.  History of Present Illness:    Observations/Objective:   Assessment and Plan:   Follow Up Instructions:    I discussed the assessment and treatment plan with the patient. The patient was provided an opportunity to ask questions and all were answered. The patient agreed with the plan and demonstrated an understanding of the instructions.   The patient was advised to call back or seek an in-person evaluation if the symptoms worsen or if the condition fails to improve as anticipated.  I provided 20 minutes of non-face-to-face time during this encounter.   Vincent Males, LPN  Patient did get a diffuse headache for the 2 days he was off blood pressure meds.  Generally takes in the morning.  Usually does not miss a dose.  Lots of exercise with his job.  Watching salt intake  Occasional need for inhaler.  Needs refills.  Review of Systems  Neurological: Positive for headaches.       Objective:   Physical Exam   Virtual     Assessment & Plan:  Impression essential hypertension.  States blood pressure in good control when he checks it at his father's once every month or so.  To  maintain same dose.  Try nightly rationale discussed  2.  Asthma clinically stable will refill inhaler to use as needed  Follow-up in 6 months diet exercise discussed flu shot encouraged

## 2019-05-05 DIAGNOSIS — U071 COVID-19: Secondary | ICD-10-CM | POA: Diagnosis not present

## 2019-05-05 DIAGNOSIS — Z20828 Contact with and (suspected) exposure to other viral communicable diseases: Secondary | ICD-10-CM | POA: Diagnosis not present

## 2019-05-05 DIAGNOSIS — Z03818 Encounter for observation for suspected exposure to other biological agents ruled out: Secondary | ICD-10-CM | POA: Diagnosis not present

## 2019-05-11 ENCOUNTER — Encounter: Payer: Self-pay | Admitting: Emergency Medicine

## 2019-05-11 ENCOUNTER — Ambulatory Visit
Admission: EM | Admit: 2019-05-11 | Discharge: 2019-05-11 | Disposition: A | Discharge status: Home / Self Care | Payer: Self-pay | Attending: Emergency Medicine | Admitting: Emergency Medicine

## 2019-05-11 ENCOUNTER — Other Ambulatory Visit: Payer: Self-pay

## 2019-05-11 DIAGNOSIS — I10 Essential (primary) hypertension: Secondary | ICD-10-CM

## 2019-05-11 DIAGNOSIS — U071 COVID-19: Secondary | ICD-10-CM

## 2019-05-11 MED ORDER — PREDNISONE 20 MG PO TABS: 20.0000 mg | ORAL_TABLET | Freq: Every day | ORAL | 0 refills | Status: DC

## 2019-05-11 MED ORDER — ALBUTEROL SULFATE HFA 108 (90 BASE) MCG/ACT IN AERS: INHALATION_SPRAY | RESPIRATORY_TRACT | 5 refills | Status: DC

## 2019-05-11 NOTE — ED Triage Notes (Signed)
Pt presents to [facility] after testing positive last week for COVID.  Patient states he is continuing to have cough and congestion, on an inhaler already.  States he came for a chest xray and antibiotics.  Pt denies SOB.

## 2019-05-11 NOTE — ED Provider Notes (Signed)
[facility]    CSN: [id number] Arrival date & time: 05/11/19  0935      History   Chief Complaint Chief Complaint  Patient presents with  . APPT: 930am  . Cough    HPI Vincent Solis is a 24 y.o. male with history of hypertension, obesity, allergies presenting for evaluation of persistent Covid symptoms.  Patient endorsing dry cough, nasal congestion, dyspnea on exertion.  States symptom onset was 12/23: Underwent testing 12/26 with positive result.  Patient has been taking Mucinex, albuterol inhaler, Tylenol, Zyrtec with relief of symptoms.  Patient voices frustration that symptoms have not resolved completely.  Patient requesting medication refill of albuterol inhaler.  Overall, patient feels he is doing better than symptom onset.    Past Medical History:  Diagnosis Date  . Hypertension    being observed at present- will re evaluate 6 months after gastric sleeve surgery.  . Low testosterone   . Obesity   . PONV (postoperative nausea and vomiting)   . Seasonal allergies     Patient Active Problem List   Diagnosis Date Noted  . 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 Morbid obesity (HCC) 05/29/2015  . Tonsillar hypertrophy 05/13/2015  . Essential hypertension, benign 07/27/2013  . Fasting hyperinsulinemia with normal glucose 07/27/2013  . Sprain of ankle, unspecified site 11/19/2012  . Low testosterone   . Seasonal allergies     Past Surgical History:  Procedure Laterality Date  . APPENDECTOMY     Laparoscopic -age 71  . LAPAROSCOPIC GASTRIC SLEEVE RESECTION N/A 08/04/2015   Procedure: LAPAROSCOPIC GASTRIC SLEEVE RESECTION;  Surgeon: Luretha Murphy, MD;  Location: [facility];  Service: General;  Laterality: N/A;  . UPPER GI ENDOSCOPY N/A 08/04/2015   Procedure: UPPER GI ENDOSCOPY;  Surgeon: Luretha Murphy, MD;  Location: [facility];  Service: General;  Laterality: N/A;       Home Medications    Prior to Admission medications   Medication  Sig Start Date End Date Taking? Authorizing Provider  albuterol (VENTOLIN HFA) 108 (90 Base) MCG/ACT inhaler TAKE 2 PUFFS BY MOUTH 4 TIMES A DAY AS NEEDED FOR WHEEZE 05/11/19   Hall-Potvin, [place], PA-C  enalapril (VASOTEC) 10 MG tablet Take 1 tablet (10 mg total) by mouth daily. 01/22/19   Merlyn Albert, MD  Multiple Vitamin (MULTIVITAMIN WITH MINERALS) TABS tablet Take 1 tablet by mouth daily. Reported on 07/27/2015    [provider]  predniSONE (DELTASONE) 20 MG tablet Take 1 tablet (20 mg total) by mouth daily. 05/11/19   Hall-Potvin, [place], PA-C    Family History Family History  Problem Relation Age of Onset  . Mental illness Mother   . Hyperlipidemia Mother   . Hyperlipidemia Father   . GER disease Father   . Malignant hyperthermia Father   . Cancer Other   . Colitis Other     Social History Social History   Tobacco Use  . Smoking status: Never Smoker  . Smokeless tobacco: Never Used  Substance Use Topics  . Alcohol use: Yes    Comment: rare social  . Drug use: No     Allergies   Sulfa antibiotics   Review of Systems Review of Systems  Constitutional: Negative for chills and fever.  HENT: Positive for congestion. Negative for sinus pressure and sinus pain.   Respiratory: Positive for cough. Negative for wheezing.   Cardiovascular: Negative for chest pain and palpitations.  Gastrointestinal: Negative for abdominal pain, diarrhea and vomiting.  Physical Exam Triage Vital Signs ED Triage Vitals  Enc Vitals Group     BP 05/11/19 1032 (!) 128/93     Pulse Rate 05/11/19 1032 98     Resp 05/11/19 1032 18     Temp 05/11/19 1032 98 F (36.7 C)     Temp Source 05/11/19 1032 Temporal     SpO2 05/11/19 1032 96 %     Weight --      Height --      Head Circumference --      Peak Flow --      Pain Score 05/11/19 1034 0     Pain Loc --      Pain Edu? --      Excl. in GC? --    No data found.  Updated Vital Signs BP (!) 128/93 (BP Location:  Left Arm)   Pulse 98   Temp 98 F (36.7 C) (Temporal)   Resp 18   SpO2 96%   Visual Acuity Right Eye Distance:   Left Eye Distance:   Bilateral Distance:    Right Eye Near:   Left Eye Near:    Bilateral Near:     Physical Exam Constitutional:      General: He is not in acute distress.    Appearance: He is obese. He is not ill-appearing.  HENT:     Head: Normocephalic and atraumatic.     Mouth/Throat:     Mouth: Mucous membranes are moist.     Pharynx: Oropharynx is clear.  Eyes:     General: No scleral icterus.    Pupils: Pupils are equal, round, and reactive to light.  Neck:     Comments: Trachea midline, negative JVD Cardiovascular:     Rate and Rhythm: Normal rate and regular rhythm.     Heart sounds: No murmur. No gallop.   Pulmonary:     Effort: Pulmonary effort is normal. No respiratory distress.     Breath sounds: No stridor. No wheezing, rhonchi or rales.  Musculoskeletal:        General: Normal range of motion.     Right lower leg: No edema.     Left lower leg: No edema.  Skin:    Capillary Refill: Capillary refill takes less than 2 seconds.     Coloration: Skin is not jaundiced or pale.  Neurological:     General: No focal deficit present.     Mental Status: He is alert and oriented to person, place, and time.      UC Treatments / Results  Labs (all labs ordered are listed, but only abnormal results are displayed) Labs Reviewed - No data to display  EKG   Radiology No results found.  Procedures Procedures (including critical care time)  Medications Ordered in UC Medications - No data to display  Initial Impression / Assessment and Plan / UC Course  I have reviewed the triage vital signs and the nursing notes.  Pertinent labs & imaging results that were available during my care of the patient were reviewed by me and considered in my medical decision making (see chart for details).     Patient afebrile, nontoxic.  Shared decision  making resulting in deferment of chest x-ray at this time as he feels symptoms have improved since onset, cough is nonproductive, he has been afebrile, and lung exam was unremarkable in office.  Patient states he was largely seeking someone to check him out in person.  Will start prednisone, refill inhaler  for cough.  Return precautions discussed, patient verbalized understanding and is agreeable to plan. Final Clinical Impressions(s) / UC Diagnoses   Final diagnoses:  [id number] virus infection     Discharge Instructions     Take steroid as prescribed. Continue albuterol inhaler. Return for worsening cough, fever, SOB    ED Prescriptions    Medication Sig Dispense Auth. Provider   albuterol (VENTOLIN HFA) 108 (90 Base) MCG/ACT inhaler TAKE 2 PUFFS BY MOUTH 4 TIMES A DAY AS NEEDED FOR WHEEZE 18 g Hall-Potvin, [place], PA-C   predniSONE (DELTASONE) 20 MG tablet Take 1 tablet (20 mg total) by mouth daily. 7 tablet Hall-Potvin, [place], PA-C     PDMP not reviewed this encounter.   Hall-Potvin, [place], [place] 05/12/19 1847

## 2019-05-11 NOTE — ED Notes (Signed)
Updated on delays for a room 

## 2019-05-11 NOTE — Discharge Instructions (Signed)
Take steroid as prescribed. Continue albuterol inhaler. Return for worsening cough, fever, SOB

## 2019-06-05 ENCOUNTER — Encounter: Payer: Self-pay | Admitting: Family Medicine

## 2019-06-06 ENCOUNTER — Encounter: Payer: Self-pay | Admitting: Family Medicine

## 2019-06-06 ENCOUNTER — Other Ambulatory Visit: Payer: Self-pay

## 2019-06-06 ENCOUNTER — Ambulatory Visit (INDEPENDENT_AMBULATORY_CARE_PROVIDER_SITE_OTHER): Payer: BC Managed Care – PPO | Admitting: Family Medicine

## 2019-06-06 DIAGNOSIS — U071 COVID-19: Secondary | ICD-10-CM

## 2019-06-06 MED ORDER — ENALAPRIL MALEATE 10 MG PO TABS: 10.0000 mg | ORAL_TABLET | Freq: Every day | ORAL | 5 refills | Status: DC

## 2019-06-06 MED ORDER — ALPRAZOLAM 0.5 MG PO TABS: ORAL_TABLET | ORAL | 0 refills | Status: DC

## 2019-06-06 NOTE — Progress Notes (Signed)
   Subjective:    Patient ID: Vincent Solis, male    DOB: 1996-05-01, 24 y.o.   MRN: [id number]  HPIpt states he was diagnosed with covid and has been out of quarantine for 3 weeks now. Experiencing insomnia. Not sleeping for 1 -2 days at a time. Chills. Tinging through the arms.   Virtual Visit via Telephone Note  I connected with Vincent Solis on 06/06/19 at 11:00 AM EST by telephone and verified that I am speaking with the correct person using two identifiers.  Location: Patient: home Provider: office   I discussed the limitations, risks, security and privacy concerns of performing an evaluation and management service by telephone and the availability of in person appointments. I also discussed with the patient that there may be a patient responsible charge related to this service. The patient expressed understanding and agreed to proceed.   History of Present Illness:    Observations/Objective:   Assessment and Plan:   Follow Up Instructions:    I discussed the assessment and treatment plan with the patient. The patient was provided an opportunity to ask questions and all were answered. The patient agreed with the plan and demonstrated an understanding of the instructions.   The patient was advised to call back or seek an in-person evaluation if the symptoms worsen or if the condition fails to improve as anticipated.  I provided 20 minutes of non-face-to-face time during this encounter.      Review of Systems No chest pain no shortness of breath no abdominal pain.  Blood pressure overall generally in good control when checked    Objective:   Physical Exam   Virtual     Assessment & Plan:  Impression persistent symptomatology after COVID-19 infection.  Still having sensory tingling and sensory changes in the arms and legs.  Also notes a lot of anxiety.  Worsening of insomnia at night.  Use some of her his father's Xanax and stated it helped well.  Would like to have  a small number in order to utilize.  This is prescribed.  Overall reassurance given symptoms should improve over time

## 2019-07-01 DIAGNOSIS — M955 Acquired deformity of pelvis: Secondary | ICD-10-CM | POA: Diagnosis not present

## 2019-07-01 DIAGNOSIS — M5416 Radiculopathy, lumbar region: Secondary | ICD-10-CM | POA: Diagnosis not present

## 2019-07-01 DIAGNOSIS — M9903 Segmental and somatic dysfunction of lumbar region: Secondary | ICD-10-CM | POA: Diagnosis not present

## 2019-07-01 DIAGNOSIS — M9905 Segmental and somatic dysfunction of pelvic region: Secondary | ICD-10-CM | POA: Diagnosis not present

## 2019-07-02 DIAGNOSIS — M5416 Radiculopathy, lumbar region: Secondary | ICD-10-CM | POA: Diagnosis not present

## 2019-07-02 DIAGNOSIS — M9905 Segmental and somatic dysfunction of pelvic region: Secondary | ICD-10-CM | POA: Diagnosis not present

## 2019-07-02 DIAGNOSIS — M9903 Segmental and somatic dysfunction of lumbar region: Secondary | ICD-10-CM | POA: Diagnosis not present

## 2019-07-02 DIAGNOSIS — M955 Acquired deformity of pelvis: Secondary | ICD-10-CM | POA: Diagnosis not present

## 2019-07-03 DIAGNOSIS — M9905 Segmental and somatic dysfunction of pelvic region: Secondary | ICD-10-CM | POA: Diagnosis not present

## 2019-07-03 DIAGNOSIS — M9903 Segmental and somatic dysfunction of lumbar region: Secondary | ICD-10-CM | POA: Diagnosis not present

## 2019-07-03 DIAGNOSIS — M955 Acquired deformity of pelvis: Secondary | ICD-10-CM | POA: Diagnosis not present

## 2019-07-03 DIAGNOSIS — M5416 Radiculopathy, lumbar region: Secondary | ICD-10-CM | POA: Diagnosis not present

## 2019-07-08 DIAGNOSIS — M955 Acquired deformity of pelvis: Secondary | ICD-10-CM | POA: Diagnosis not present

## 2019-07-08 DIAGNOSIS — M9903 Segmental and somatic dysfunction of lumbar region: Secondary | ICD-10-CM | POA: Diagnosis not present

## 2019-07-08 DIAGNOSIS — M5416 Radiculopathy, lumbar region: Secondary | ICD-10-CM | POA: Diagnosis not present

## 2019-07-08 DIAGNOSIS — M9905 Segmental and somatic dysfunction of pelvic region: Secondary | ICD-10-CM | POA: Diagnosis not present

## 2019-07-10 DIAGNOSIS — M9903 Segmental and somatic dysfunction of lumbar region: Secondary | ICD-10-CM | POA: Diagnosis not present

## 2019-07-10 DIAGNOSIS — M955 Acquired deformity of pelvis: Secondary | ICD-10-CM | POA: Diagnosis not present

## 2019-07-10 DIAGNOSIS — M5416 Radiculopathy, lumbar region: Secondary | ICD-10-CM | POA: Diagnosis not present

## 2019-07-10 DIAGNOSIS — M9905 Segmental and somatic dysfunction of pelvic region: Secondary | ICD-10-CM | POA: Diagnosis not present

## 2019-07-15 DIAGNOSIS — M9905 Segmental and somatic dysfunction of pelvic region: Secondary | ICD-10-CM | POA: Diagnosis not present

## 2019-07-15 DIAGNOSIS — M955 Acquired deformity of pelvis: Secondary | ICD-10-CM | POA: Diagnosis not present

## 2019-07-15 DIAGNOSIS — M5416 Radiculopathy, lumbar region: Secondary | ICD-10-CM | POA: Diagnosis not present

## 2019-07-15 DIAGNOSIS — M9903 Segmental and somatic dysfunction of lumbar region: Secondary | ICD-10-CM | POA: Diagnosis not present

## 2019-07-25 ENCOUNTER — Other Ambulatory Visit: Payer: Self-pay

## 2019-07-25 ENCOUNTER — Encounter: Payer: Self-pay | Admitting: Emergency Medicine

## 2019-07-25 ENCOUNTER — Ambulatory Visit
Admission: EM | Admit: 2019-07-25 | Discharge: 2019-07-25 | Disposition: A | Discharge status: Home / Self Care | Payer: BC Managed Care – PPO | Attending: Emergency Medicine | Admitting: Emergency Medicine

## 2019-07-25 DIAGNOSIS — G510 Bell's palsy: Secondary | ICD-10-CM

## 2019-07-25 MED ORDER — PREDNISONE 20 MG PO TABS: 60.0000 mg | ORAL_TABLET | Freq: Every day | ORAL | 0 refills | Status: DC

## 2019-07-25 NOTE — ED Provider Notes (Signed)
[facility]    CSN: [id number] Arrival date & time: 07/25/19  1006      History   Chief Complaint Chief Complaint  Patient presents with  . Numbness    HPI Vincent Solis is a 24 y.o. male.   Patient presents with progressive numbness and tingling on his right face/head x3 days.  He states the sensation started behind his right ear and has progressed to the right side of his tongue and jaw.  He states his smile is asymmetrical and his right eye is watery.  He also reports a mild headache.  He denies weakness or paresthesias in his extremities.  He denies fever, chills, rash, dizziness, headache, chest pain, shortness of breath, or other symptoms.  No treatment attempted at home.  The history is provided by the patient.    Past Medical History:  Diagnosis Date  . Hypertension    being observed at present- will re evaluate 6 months after gastric sleeve surgery.  . Low testosterone   . Obesity   . PONV (postoperative nausea and vomiting)   . Seasonal allergies     Patient Active Problem List   Diagnosis Date Noted  . 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 Morbid obesity (HCC) 05/29/2015  . Tonsillar hypertrophy 05/13/2015  . Essential hypertension, benign 07/27/2013  . Fasting hyperinsulinemia with normal glucose 07/27/2013  . Sprain of ankle, unspecified site 11/19/2012  . Low testosterone   . Seasonal allergies     Past Surgical History:  Procedure Laterality Date  . APPENDECTOMY     Laparoscopic -age 72  . LAPAROSCOPIC GASTRIC SLEEVE RESECTION N/A 08/04/2015   Procedure: LAPAROSCOPIC GASTRIC SLEEVE RESECTION;  Surgeon: Luretha Murphy, MD;  Location: [facility];  Service: General;  Laterality: N/A;  . UPPER GI ENDOSCOPY N/A 08/04/2015   Procedure: UPPER GI ENDOSCOPY;  Surgeon: Luretha Murphy, MD;  Location: [facility];  Service: General;  Laterality: N/A;       Home Medications    Prior to Admission medications   Medication Sig Start Date  End Date Taking? Authorizing Provider  albuterol (VENTOLIN HFA) 108 (90 Base) MCG/ACT inhaler TAKE 2 PUFFS BY MOUTH 4 TIMES A DAY AS NEEDED FOR WHEEZE 05/11/19  Yes Hall-Potvin, [place], PA-C  ALPRAZolam (XANAX) 0.5 MG tablet Take one qhs prn insomnia or anxiety 06/06/19  Yes Merlyn Albert, MD  enalapril (VASOTEC) 10 MG tablet Take 1 tablet (10 mg total) by mouth daily. 06/06/19  Yes Merlyn Albert, MD  Multiple Vitamin (MULTIVITAMIN WITH MINERALS) TABS tablet Take 1 tablet by mouth daily. Reported on 07/27/2015   Yes [provider]  predniSONE (DELTASONE) 20 MG tablet Take 3 tablets (60 mg total) by mouth daily with breakfast for 7 days. 07/25/19 08/01/19  Mickie Bail, NP    Family History Family History  Problem Relation Age of Onset  . Mental illness Mother   . Hyperlipidemia Mother   . Hyperlipidemia Father   . GER disease Father   . Malignant hyperthermia Father   . Cancer Other   . Colitis Other     Social History Social History   Tobacco Use  . Smoking status: Never Smoker  . Smokeless tobacco: Never Used  Substance Use Topics  . Alcohol use: Yes    Comment: rare social  . Drug use: No     Allergies   Sulfa antibiotics   Review of Systems Review of Systems  Constitutional: Negative for chills and fever.  HENT: Negative for ear pain and sore throat.   Eyes: Negative for pain and visual disturbance.  Respiratory: Negative for cough and shortness of breath.   Cardiovascular: Negative for chest pain and palpitations.  Gastrointestinal: Negative for abdominal pain and vomiting.  Genitourinary: Negative for dysuria and hematuria.  Musculoskeletal: Negative for arthralgias and back pain.  Skin: Negative for color change and rash.  Neurological: Positive for facial asymmetry, numbness and headaches. Negative for dizziness, seizures, syncope and speech difficulty.  All other systems reviewed and are negative.    Physical Exam Triage Vital Signs ED  Triage Vitals  Enc Vitals Group     BP 07/25/19 1015 (!) 161/90     Pulse Rate 07/25/19 1015 83     Resp 07/25/19 1015 18     Temp 07/25/19 1015 98.5 F (36.9 C)     Temp Source 07/25/19 1015 Oral     SpO2 07/25/19 1015 97 %     Weight --      Height --      Head Circumference --      Peak Flow --      Pain Score 07/25/19 1016 4     Pain Loc --      Pain Edu? --      Excl. in GC? --    No data found.  Updated Vital Signs BP (!) 161/90 (BP Location: Left Arm)   Pulse 83   Temp 98.5 F (36.9 C) (Oral)   Resp 18   Ht 6\' 4"  (1.93 m)   Wt 300 lb (136.1 kg)   SpO2 97%   BMI 36.52 kg/m   Visual Acuity Right Eye Distance:   Left Eye Distance:   Bilateral Distance:    Right Eye Near:   Left Eye Near:    Bilateral Near:     Physical Exam Vitals and nursing note reviewed.  Constitutional:      General: He is not in acute distress.    Appearance: He is well-developed.  HENT:     Head: Normocephalic and atraumatic.     Mouth/Throat:     Mouth: Mucous membranes are moist.     Pharynx: Oropharynx is clear.  Eyes:     Conjunctiva/sclera: Conjunctivae normal.     Pupils: Pupils are equal, round, and reactive to light.  Cardiovascular:     Rate and Rhythm: Normal rate and regular rhythm.     Heart sounds: No murmur.  Pulmonary:     Effort: Pulmonary effort is normal. No respiratory distress.     Breath sounds: Normal breath sounds.  Abdominal:     Palpations: Abdomen is soft.     Tenderness: There is no abdominal tenderness. There is no guarding or rebound.  Musculoskeletal:     Cervical back: Neck supple.  Skin:    General: Skin is warm and dry.  Neurological:     Mental Status: He is alert and oriented to person, place, and time.     Cranial Nerves: Cranial nerve deficit present.     Gait: Gait normal.     Comments: Facial asymmetry: Right side facial weakness. Able to close right eye completely with minimal effort.   Upper and lower extremities: strength 5/5,  FROM, hand grips strong, gait normal.     Psychiatric:        Mood and Affect: Mood normal.        Behavior: Behavior normal.      UC Treatments / Results  Labs (all  labs ordered are listed, but only abnormal results are displayed) Labs Reviewed - No data to display  EKG   Radiology No results found.  Procedures Procedures (including critical care time)  Medications Ordered in UC Medications - No data to display  Initial Impression / Assessment and Plan / UC Course  I have reviewed the triage vital signs and the nursing notes.  Pertinent labs & imaging results that were available during my care of the patient were reviewed by me and considered in my medical decision making (see chart for details).   Bell's Palsy - House-Brackman Grade II-III.  Treating with prednisone 60 mg daily x 7 days.  Eye care instructions discussed with patient.  Patient has appointment scheduled with his PCP tomorrow; instructed him to proceed with this appointment.  Instructed patient to go to the ED if he has weakness in other parts of his body or other concerning symptoms.  Patient agrees to plan of care.      Final Clinical Impressions(s) / UC Diagnoses   Final diagnoses:  Bell's palsy     Discharge Instructions     Take the prednisone as directed.    Keep your right eye covered at night and use moisturizing eye drops frequently during the day.    Follow up as scheduled with your primary care provider tomorrow.        ED Prescriptions    Medication Sig Dispense Auth. Provider   predniSONE (DELTASONE) 20 MG tablet Take 3 tablets (60 mg total) by mouth daily with breakfast for 7 days. 21 tablet Sharion Balloon, NP     PDMP not reviewed this encounter.   Sharion Balloon, NP 07/25/19 1059

## 2019-07-25 NOTE — Discharge Instructions (Signed)
Take the prednisone as directed.    Keep your right eye covered at night and use moisturizing eye drops frequently during the day.    Follow up as scheduled with your primary care provider tomorrow.

## 2019-07-25 NOTE — ED Triage Notes (Signed)
Pt c/o numbness and tingling on his right side of his body. His smile is crocked when asked to smile, eye is watering. He states he does have a headache on his right side of his head.

## 2019-07-26 ENCOUNTER — Ambulatory Visit (INDEPENDENT_AMBULATORY_CARE_PROVIDER_SITE_OTHER): Payer: BC Managed Care – PPO | Admitting: Family Medicine

## 2019-07-26 DIAGNOSIS — G510 Bell's palsy: Secondary | ICD-10-CM | POA: Diagnosis not present

## 2019-07-26 NOTE — Progress Notes (Signed)
   Subjective:  Audio plus video  Patient ID: Vincent Solis, male    DOB: [birth year], 24 y.o.   MRN: [id number]  HPI Patient went to Urgent care yesterday and was diagnosed with Grade 2 Bell's Palsy. Numbness and drooping on right side of face and head.    Review of Systems     Objective:   Physical Exam   Virtual Visit via Video Note  I connected with [name] on 07/26/19 at 11:00 AM EDT by a video enabled telemedicine application and verified that I am speaking with the correct person using two identifiers.  Location: Patient: home Provider: office   I discussed the limitations of evaluation and management by telemedicine and the availability of in person appointments. The patient expressed understanding and agreed to proceed.  History of Present Illness:    Observations/Objective:   Assessment and Plan:   Follow Up Instructions:    I discussed the assessment and treatment plan with the patient. The patient was provided an opportunity to ask questions and all were answered. The patient agreed with the plan and demonstrated an understanding of the instructions.   The patient was advised to call back or seek an in-person evaluation if the symptoms worsen or if the condition fails to improve as anticipated.  I provided 22 minutes of non-face-to-face time during this encounter.  Acute development within the last couple days.  Right-sided weakness.  Some difficulty closing eye completely.  2 months post acute Covid.  Some sensory changes on the side of the tongue.  Notes diminished sensation right cheek.       Assessment & Plan:  Impression.  Bell's palsy.  Plan follow-up next week in the office.  Maintain prednisone.  Probably associated with Covid.  Discussed.  Warning signs discussed.  Eye care discussed

## 2019-08-01 ENCOUNTER — Encounter: Payer: Self-pay | Admitting: Neurology

## 2019-08-01 ENCOUNTER — Other Ambulatory Visit: Payer: Self-pay

## 2019-08-01 ENCOUNTER — Ambulatory Visit: Payer: BC Managed Care – PPO | Admitting: Family Medicine

## 2019-08-01 ENCOUNTER — Encounter: Payer: Self-pay | Admitting: Family Medicine

## 2019-08-01 VITALS — BP 140/92 | Temp 97.9°F | Wt 333.8 lb

## 2019-08-01 DIAGNOSIS — G510 Bell's palsy: Secondary | ICD-10-CM

## 2019-08-01 DIAGNOSIS — I10 Essential (primary) hypertension: Secondary | ICD-10-CM | POA: Diagnosis not present

## 2019-08-01 MED ORDER — DOXYCYCLINE HYCLATE 100 MG PO TABS: ORAL_TABLET | ORAL | 0 refills | Status: DC

## 2019-08-01 MED ORDER — VALACYCLOVIR HCL 1 G PO TABS: ORAL_TABLET | ORAL | 0 refills | Status: DC

## 2019-08-01 MED ORDER — ALBUTEROL SULFATE HFA 108 (90 BASE) MCG/ACT IN AERS: INHALATION_SPRAY | RESPIRATORY_TRACT | 5 refills | Status: DC

## 2019-08-01 MED ORDER — ENALAPRIL MALEATE 10 MG PO TABS: 10.0000 mg | ORAL_TABLET | Freq: Every day | ORAL | 5 refills | Status: DC

## 2019-08-01 NOTE — Progress Notes (Signed)
   Subjective:    Patient ID: Vincent Solis, male    DOB: 1996-03-15, 24 y.o.   MRN: [id number]  HPI Pt here for one week follow up on Bells Palsy. Pt states he is doing OK. No changes. Pt did finish Prednisone yesterday.    Patient compliant with blood pressure medication.  Does not miss a dose.  Generally numbers are in good control.  States the Bell's palsy really has not improved.  Of note patient is now 60 days beyond diagnosis of COVID-19.  Patient has taken all of his Valtrex.  Patient is using proper lubrication.  Notes a bit of challenge sometimes with speaking or swallowing. Review of Systems No headache, no major weight loss or weight gain, no chest pain no back pain abdominal pain no change in bowel habits complete ROS otherwise negative     Objective:   Physical Exam  Alert vitals stable, NAD. Blood pressure good on repeat. HEENT normal. Lungs clear. Heart regular rate and rhythm. Substantial weakness right side of face.  Including high nasolabial fold muscles and intrinsic of cheek etc.  Slight diminished sensation in platysmal region.      Assessment & Plan:  Impression Bell's palsy.  Idiopathic.  Discussed at length.  Will give trial of Botox and Valtrex.  Eye care discussed.  Long-term anticipation of ongoing improvement discussed.  Neurology referral recommended due to patient's age and need to 100% ascertain we are doing all we can recover this.  2.  Hypertension decent control discussed to maintain same meds  3.  Xanax for insomnia.  Patient no longer using.  Discussed.  Greater than 50% of this. 30 Minute face to face visit was spent in counseling and discussion and coordination of care regarding the above diagnosis/diagnosies

## 2019-09-20 ENCOUNTER — Ambulatory Visit: Payer: BC Managed Care – PPO

## 2019-09-24 ENCOUNTER — Ambulatory Visit: Payer: BC Managed Care – PPO

## 2019-09-30 NOTE — Progress Notes (Deleted)
NEUROLOGY CONSULTATION NOTE  DASCHEL ROUGHTON MRN: [id number] DOB: [birth year]  Referring provider: Ardyth Gal, MD Primary care provider: Ardyth Gal, MD  Reason for consult:  Right-sided Bell's palsy  HISTORY OF PRESENT ILLNESS: Vincent Solis is a 24 year old ***-handed Caucasian male who presents for right-sided Bell's palsy.  History supplemented by Urgent Care and referring provider notes.  He had COVID-19 in late December, presenting with nasal congestion, cough and dyspnea on exertion.  In March, he developed right-sided facial numbness and tingling.  It started behind his right ear and then progressed to the right side of his tongue and jaw.  He noted some associated facial droop and watering of his right eye.  He had a mild headache, but no visual disturbance, speech disturbance, dizziness, fever or numbness and weakness of extremities.  As symptoms progressed over the next 3 days, he went to Urgent Care on 07/25/2019 for further evaluation where he was diagnosed with right sided Bell's palsy and discharged on 7 day course of prednisone.  After a week, he noted no improvement and his PCP prescribed him Valtrex.    PAST MEDICAL HISTORY: Past Medical History:  Diagnosis Date  . Hypertension    being observed at present- will re evaluate 6 months after gastric sleeve surgery.  . Low testosterone   . Obesity   . PONV (postoperative nausea and vomiting)   . Seasonal allergies     PAST SURGICAL HISTORY: Past Surgical History:  Procedure Laterality Date  . APPENDECTOMY     Laparoscopic -age 52  . LAPAROSCOPIC GASTRIC SLEEVE RESECTION N/A 08/04/2015   Procedure: LAPAROSCOPIC GASTRIC SLEEVE RESECTION;  Surgeon: Luretha Murphy, MD;  Location: [facility];  Service: General;  Laterality: N/A;  . UPPER GI ENDOSCOPY N/A 08/04/2015   Procedure: UPPER GI ENDOSCOPY;  Surgeon: Luretha Murphy, MD;  Location: [facility];  Service: General;  Laterality: N/A;    MEDICATIONS: Current Outpatient  Medications on File Prior to Visit  Medication Sig Dispense Refill  . albuterol (VENTOLIN HFA) 108 (90 Base) MCG/ACT inhaler TAKE 2 PUFFS BY MOUTH 4 TIMES A DAY AS NEEDED FOR WHEEZE 18 g 5  . doxycycline (VIBRA-TABS) 100 MG tablet Take one tablet po BID for 10 days 20 tablet 0  . enalapril (VASOTEC) 10 MG tablet Take 1 tablet (10 mg total) by mouth daily. 30 tablet 5  . Multiple Vitamin (MULTIVITAMIN WITH MINERALS) TABS tablet Take 1 tablet by mouth daily. Reported on 07/27/2015    . valACYclovir (VALTREX) 1000 MG tablet Take one tablet po TID for 7 days 14 tablet 0   No current facility-administered medications on file prior to visit.    ALLERGIES: Allergies  Allergen Reactions  . Sulfa Antibiotics Rash    FAMILY HISTORY: Family History  Problem Relation Age of Onset  . Mental illness Mother   . Hyperlipidemia Mother   . Hyperlipidemia Father   . GER disease Father   . Malignant hyperthermia Father   . Cancer Other   . Colitis Other     SOCIAL HISTORY: Social History   Socioeconomic History  . Marital status: Single    Spouse name: Not on file  . Number of children: Not on file  . Years of education: Not on file  . Highest education level: Not on file  Occupational History  . Not on file  Tobacco Use  . Smoking status: Never Smoker  . Smokeless tobacco: Never Used  Substance and Sexual Activity  . Alcohol use:  Yes    Comment: rare social  . Drug use: No  . Sexual activity: Yes  Other Topics Concern  . Not on file  Social History Narrative  . Not on file   Social Determinants of Health   Financial Resource Strain:   . Difficulty of Paying Living Expenses:   Food Insecurity:   . Worried About Charity fundraiser in the Last Year:   . Arboriculturist in the Last Year:   Transportation Needs:   . Film/video editor (Medical):   Marland Kitchen Lack of Transportation (Non-Medical):   Physical Activity:   . Days of Exercise per Week:   . Minutes of Exercise per  Session:   Stress:   . Feeling of Stress :   Social Connections:   . Frequency of Communication with Friends and Family:   . Frequency of Social Gatherings with Friends and Family:   . Attends Religious Services:   . Active Member of Clubs or Organizations:   . Attends Archivist Meetings:   Marland Kitchen Marital Status:   Intimate Partner Violence:   . Fear of Current or Ex-Partner:   . Emotionally Abused:   Marland Kitchen Physically Abused:   . Sexually Abused:     PHYSICAL EXAM: *** General: No acute distress.  Patient appears well-groomed.  Head:  Normocephalic/atraumatic Eyes:  fundi examined but not visualized Neck: supple, no paraspinal tenderness, full range of motion Back: No paraspinal tenderness Heart: regular rate and rhythm Lungs: Clear to auscultation bilaterally. Vascular: No carotid bruits. Neurological Exam: Mental status: alert and oriented to person, place, and time, recent and remote memory intact, fund of knowledge intact, attention and concentration intact, speech fluent and not dysarthric, language intact. Cranial nerves: CN I: not tested CN II: pupils equal, round and reactive to light, visual fields intact CN III, IV, VI:  full range of motion, no nystagmus, no ptosis CN V: facial sensation intact CN VII: upper and lower face symmetric CN VIII: hearing intact CN IX, X: gag intact, uvula midline CN XI: sternocleidomastoid and trapezius muscles intact CN XII: tongue midline Bulk & Tone: normal, no fasciculations. Motor:  5/5 throughout  Sensation:  Pinprick and vibration sensation intact. Deep Tendon Reflexes:  2+ throughout, toes downgoing.  Finger to nose testing:  Without dysmetria.  Heel to shin:  Without dysmetria.  Gait:  Normal station and stride.  Able to turn and tandem walk. Romberg negative.  IMPRESSION: Right-sided Bell's palsy  PLAN: ***  Thank you for allowing me to take part in the care of this patient.  Metta Clines, DO  CC: Grace Bushy.  Wolfgang Phoenix, MD

## 2019-10-01 ENCOUNTER — Ambulatory Visit: Payer: BC Managed Care – PPO | Admitting: Neurology

## 2019-10-05 ENCOUNTER — Observation Stay
Admission: EM | Admit: 2019-10-05 | Discharge: 2019-10-06 | Disposition: A | Discharge status: Home / Self Care | Payer: BC Managed Care – PPO | Attending: Internal Medicine | Admitting: Internal Medicine

## 2019-10-05 ENCOUNTER — Other Ambulatory Visit: Payer: Self-pay

## 2019-10-05 ENCOUNTER — Encounter: Payer: Self-pay | Admitting: Emergency Medicine

## 2019-10-05 ENCOUNTER — Emergency Department: Payer: BC Managed Care – PPO

## 2019-10-05 ENCOUNTER — Observation Stay: Payer: BC Managed Care – PPO

## 2019-10-05 DIAGNOSIS — R200 Anesthesia of skin: Secondary | ICD-10-CM | POA: Diagnosis not present

## 2019-10-05 DIAGNOSIS — R531 Weakness: Secondary | ICD-10-CM

## 2019-10-05 DIAGNOSIS — E669 Obesity, unspecified: Secondary | ICD-10-CM | POA: Diagnosis not present

## 2019-10-05 DIAGNOSIS — I10 Essential (primary) hypertension: Secondary | ICD-10-CM | POA: Insufficient documentation

## 2019-10-05 DIAGNOSIS — I639 Cerebral infarction, unspecified: Secondary | ICD-10-CM

## 2019-10-05 DIAGNOSIS — Z8616 Personal history of COVID-19: Secondary | ICD-10-CM | POA: Insufficient documentation

## 2019-10-05 DIAGNOSIS — Z6838 Body mass index (BMI) 38.0-38.9, adult: Secondary | ICD-10-CM | POA: Insufficient documentation

## 2019-10-05 DIAGNOSIS — G459 Transient cerebral ischemic attack, unspecified: Principal | ICD-10-CM | POA: Insufficient documentation

## 2019-10-05 DIAGNOSIS — Z9884 Bariatric surgery status: Secondary | ICD-10-CM | POA: Diagnosis not present

## 2019-10-05 DIAGNOSIS — Z79899 Other long term (current) drug therapy: Secondary | ICD-10-CM | POA: Insufficient documentation

## 2019-10-05 DIAGNOSIS — Z882 Allergy status to sulfonamides status: Secondary | ICD-10-CM | POA: Insufficient documentation

## 2019-10-05 DIAGNOSIS — Z20822 Contact with and (suspected) exposure to covid-19: Secondary | ICD-10-CM | POA: Diagnosis not present

## 2019-10-05 DIAGNOSIS — R29818 Other symptoms and signs involving the nervous system: Secondary | ICD-10-CM | POA: Diagnosis not present

## 2019-10-05 LAB — COMPREHENSIVE METABOLIC PANEL
ALT: 41 U/L (ref 0–44)
AST: 32 U/L (ref 15–41)
Albumin: 4.7 g/dL (ref 3.5–5.0)
Alkaline Phosphatase: 76 U/L (ref 38–126)
Anion gap: 9 (ref 5–15)
BUN: 15 mg/dL (ref 6–20)
CO2: 25 mmol/L (ref 22–32)
Calcium: 9.3 mg/dL (ref 8.9–10.3)
Chloride: 103 mmol/L (ref 98–111)
Creatinine, Ser: 0.64 mg/dL (ref 0.61–1.24)
GFR calc Af Amer: >60 mL/min (ref >60)
GFR calc non Af Amer: >60 mL/min (ref >60)
Glucose, Bld: 128 mg/dL — ABNORMAL HIGH (ref 70–99)
Potassium: 3.8 mmol/L (ref 3.5–5.1)
Sodium: 137 mmol/L (ref 135–145)
Total Bilirubin: 1.3 mg/dL — ABNORMAL HIGH (ref 0.3–1.2)
Total Protein: 7.6 g/dL (ref 6.5–8.1)

## 2019-10-05 LAB — CBC
HCT: 47.2 % (ref 39.0–52.0)
Hemoglobin: 16.3 g/dL (ref 13.0–17.0)
MCH: 29.3 pg (ref 26.0–34.0)
MCHC: 34.5 g/dL (ref 30.0–36.0)
MCV: 84.7 fL (ref 80.0–100.0)
Platelets: 268 10*3/uL (ref 150–400)
RBC: 5.57 MIL/uL (ref 4.22–5.81)
RDW: 12.2 % (ref 11.5–15.5)
WBC: 10 10*3/uL (ref 4.0–10.5)
nRBC: 0 % (ref 0.0–0.2)

## 2019-10-05 LAB — DIFFERENTIAL
Abs Immature Granulocytes: 0.01 10*3/uL (ref 0.00–0.07)
Basophils Absolute: 0 10*3/uL (ref 0.0–0.1)
Basophils Relative: 0 %
Eosinophils Absolute: 0.4 10*3/uL (ref 0.0–0.5)
Eosinophils Relative: 4 %
Immature Granulocytes: 0 %
Lymphocytes Relative: 30 %
Lymphs Abs: 3 10*3/uL (ref 0.7–4.0)
Monocytes Absolute: 0.9 10*3/uL (ref 0.1–1.0)
Monocytes Relative: 9 %
Neutro Abs: 5.7 10*3/uL (ref 1.7–7.7)
Neutrophils Relative %: 57 %

## 2019-10-05 LAB — PROTIME-INR
INR: 1 (ref 0.8–1.2)
Prothrombin Time: 12.7 seconds (ref 11.4–15.2)

## 2019-10-05 LAB — APTT: aPTT: 42 seconds — ABNORMAL HIGH (ref 24–36)

## 2019-10-05 IMAGING — CT CT HEAD CODE STROKE
3 series · 15 of 47 positions shown, 18 images · non-contrast
Comparison: None.

CLINICAL DATA: Code stroke. Acute neuro deficit. Right-sided
numbness and weakness to face arm and leg.

EXAM:
CT HEAD WITHOUT CONTRAST
TECHNIQUE: Contiguous axial images were obtained from the base of the skull
through the vertex without intravenous contrast.

[Series 2: head wo · axial · 0.44mm/px · z∈[+309,+434]mm · 9 of 31 slices shown, 12 images]
[im 3/31  brain]
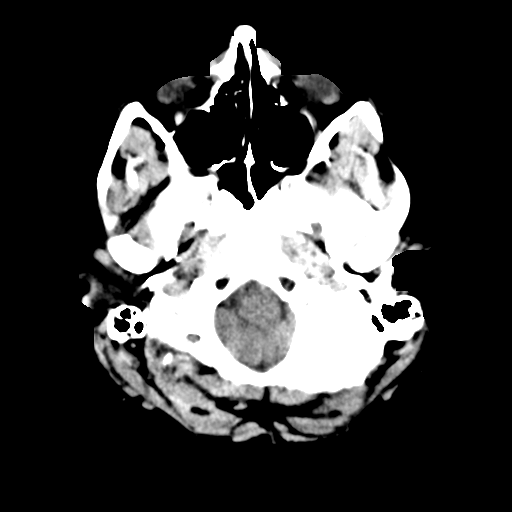
[im 3/31  bone]
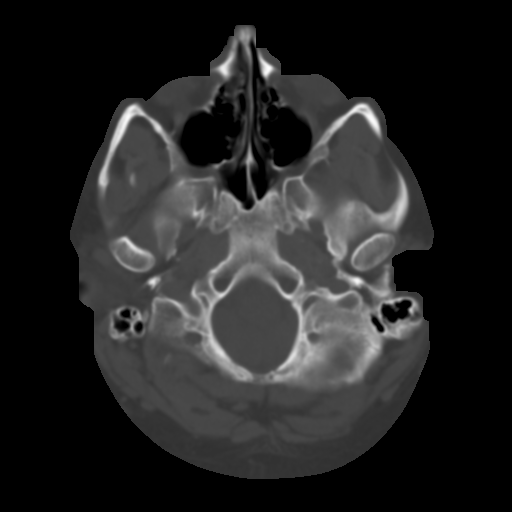
[im 6/31  brain]
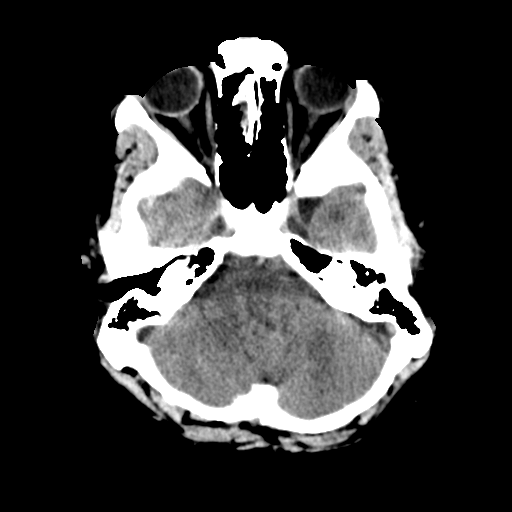
[im 9/31  brain]
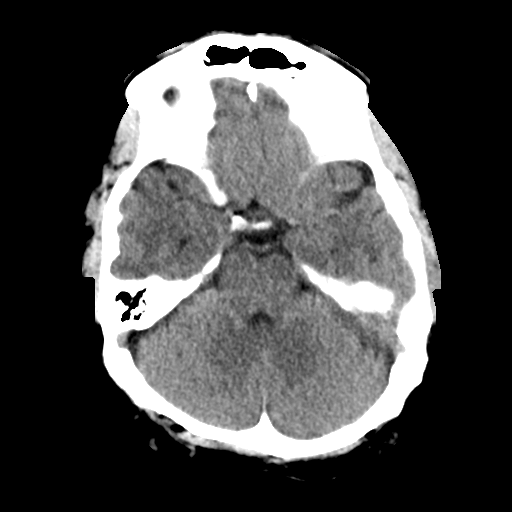
[im 12/31  brain]
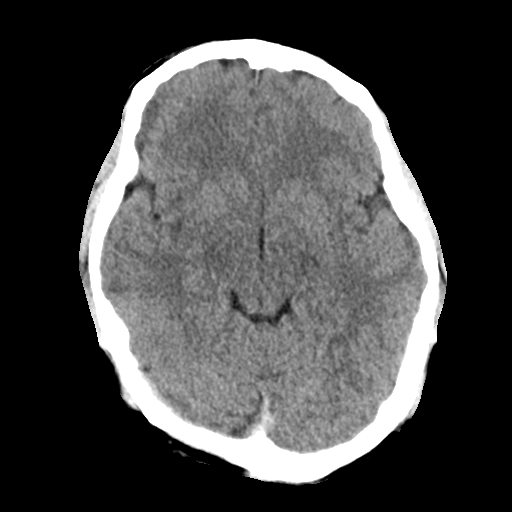
[im 16/31  brain]
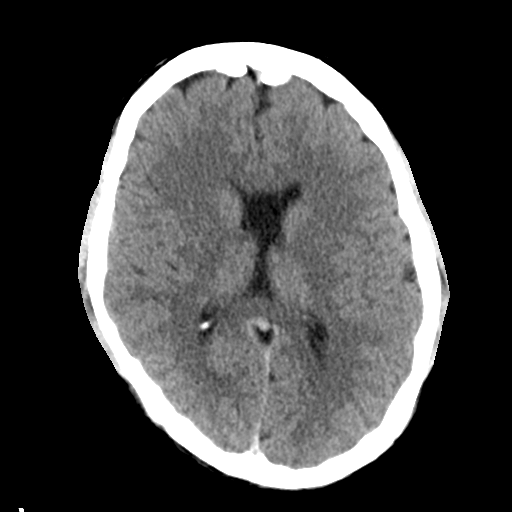
[im 16/31  bone]
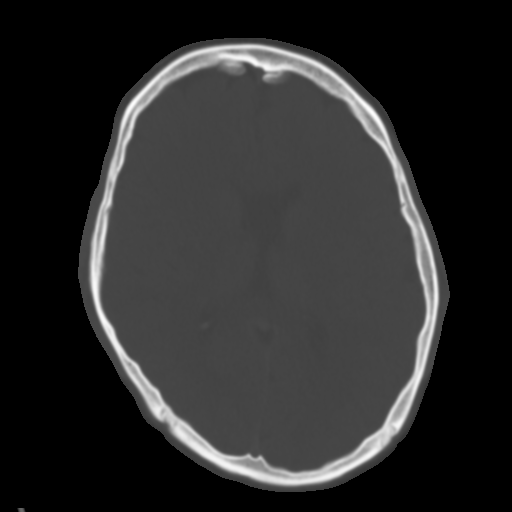
[im 19/31  brain]
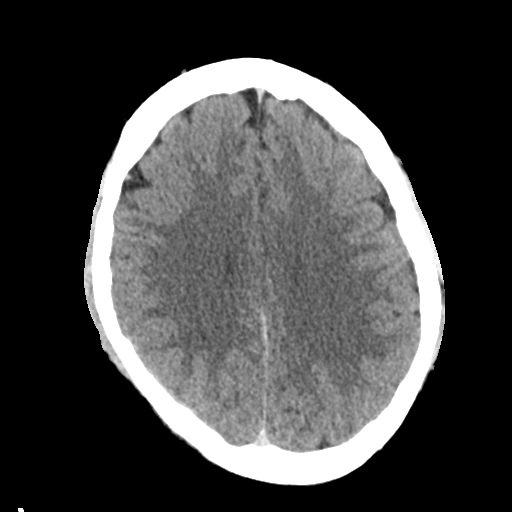
[im 22/31  brain]
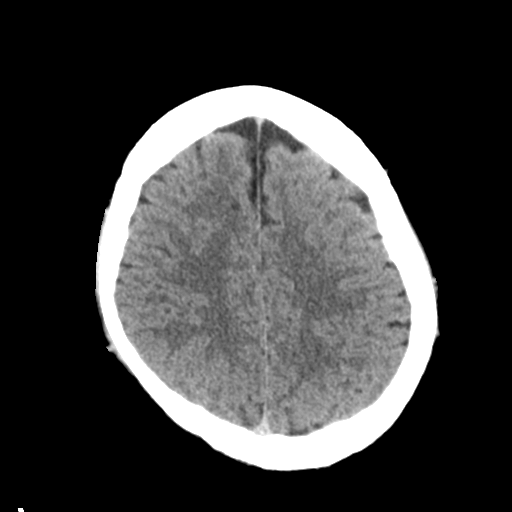
[im 25/31  brain]
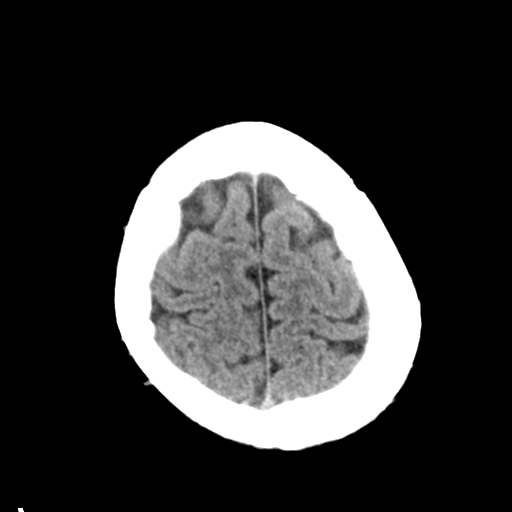
[im 28/31  brain]
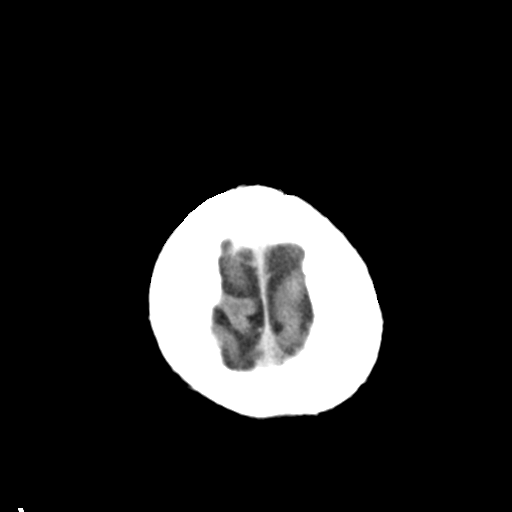
[im 28/31  bone]
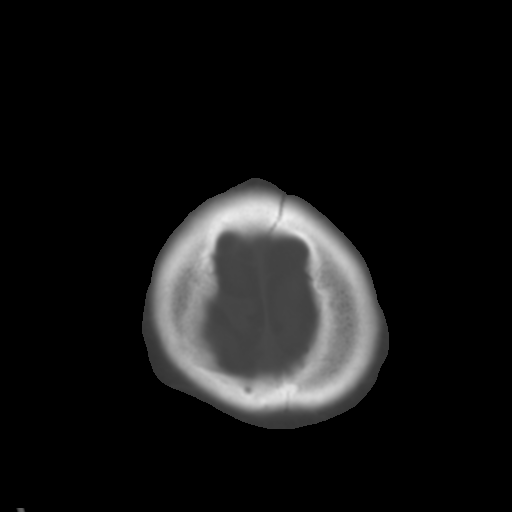

[Series 4: coronal soft tissue · coronal · 0.33mm/px · 3 of 66 slices shown]
[im 22/66  brain]
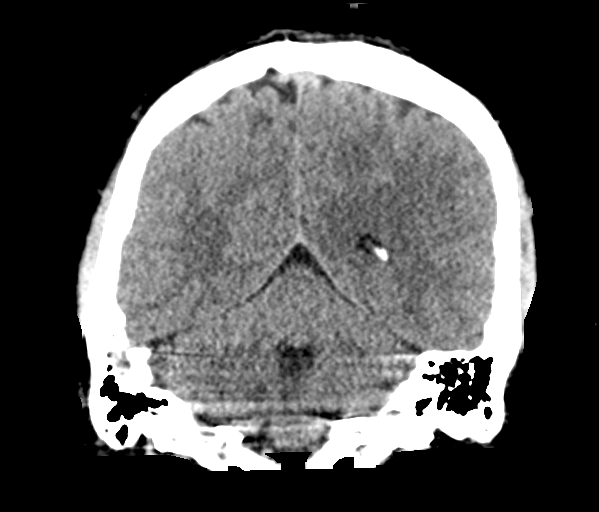
[im 29/66  brain]
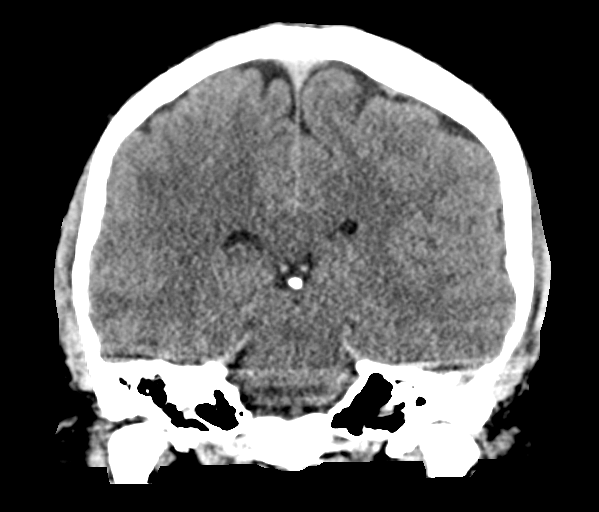
[im 37/66  brain]
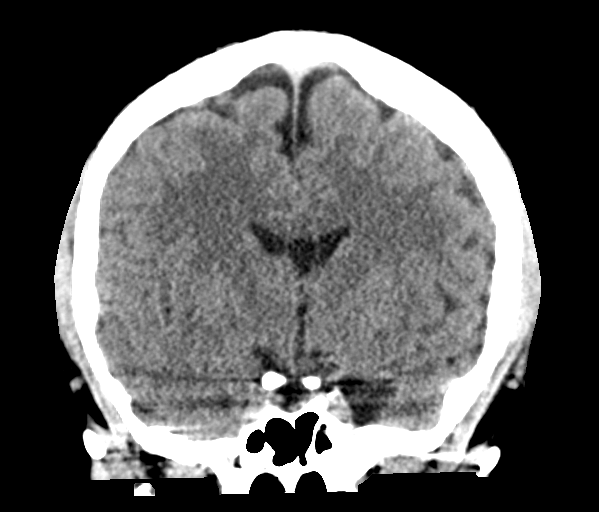

[Series 5: sagittal soft tissue · sagittal · 0.33mm/px · 3 of 59 slices shown]
[im 20/59  brain]
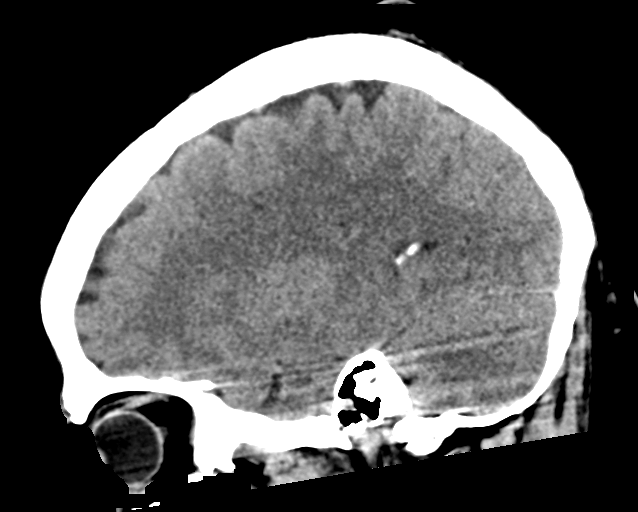
[im 30/59  brain]
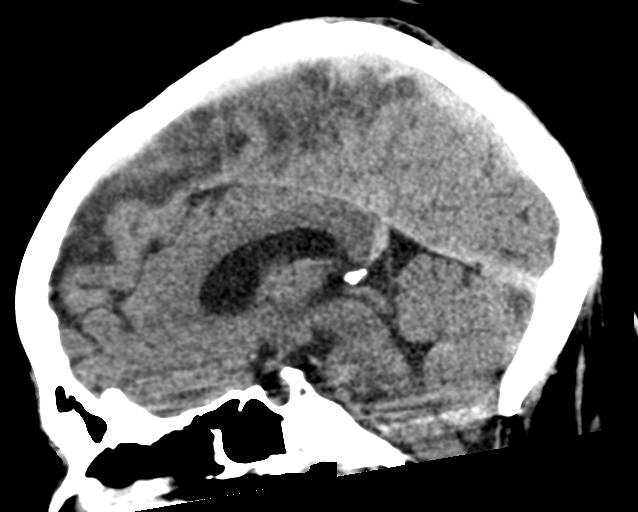
[im 39/59  brain]
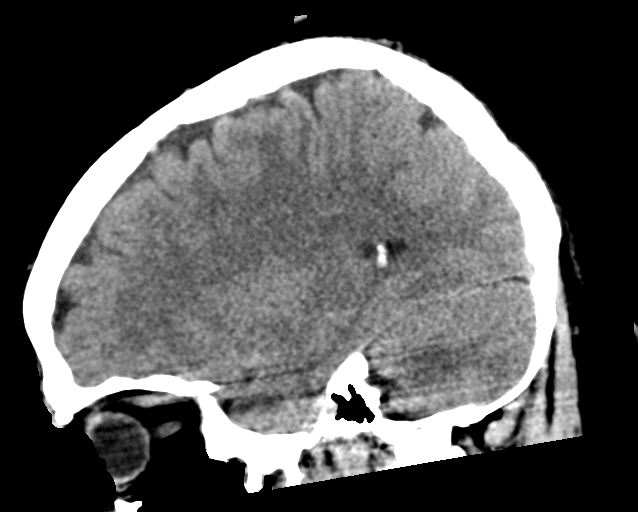

[15 of 47 positions shown; findings below may reference images not displayed]

FINDINGS: Brain: No evidence of acute infarction, hemorrhage, hydrocephalus,
extra-axial collection or mass lesion/mass effect.

Vascular: Negative for hyperdense vessel

Skull: Negative

Sinuses/Orbits: Negative

Other: None

ASPECTS (Alberta Stroke Program Early CT Score)

- Ganglionic level infarction (caudate, lentiform nuclei, internal
capsule, insula, M1-M3 cortex): 7

- Supraganglionic infarction (M4-M6 cortex): 3

Total score (0-10 with 10 being normal): 10
IMPRESSION: 1. Negative CT head
2. ASPECTS is 10
3. These results were called by telephone at the time of
interpretation on 10/05/2019 at [DATE] to provider YO FULTZ , who
verbally acknowledged these results.

## 2019-10-05 IMAGING — MR MR BRAIN/IAC WO/W CM
10 of 13 series · 27 of 48 positions shown · IV contrast (10ml Gadavist)
Comparison: None.

CLINICAL DATA: Right-sided weakness and numbness

EXAM:
MRI HEAD WITHOUT AND WITH CONTRAST
TECHNIQUE: Multiplanar, multiecho pulse sequences of the brain and surrounding
structures were obtained without and with intravenous contrast.
CONTRAST:  10mL GADAVIST GADOBUTROL 1 MMOL/ML IV SOLN

[Series 5: T1 · sagittal · 5.0mm · 0.62mm/px · 3 of 23 slices shown (1 of 3)]
[im 1/23]
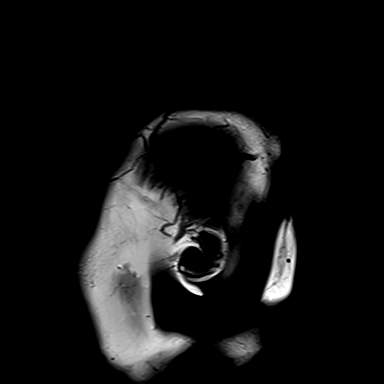
[im 12/23]
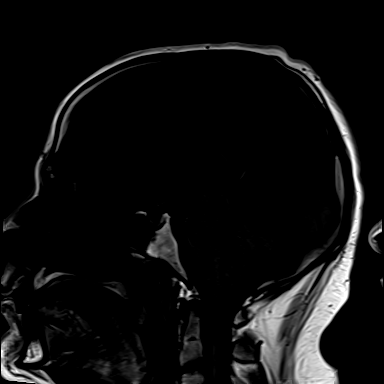
[im 23/23]
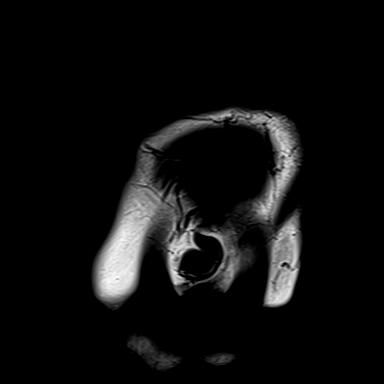

[Series 6: ax dwi_tracew · axial · 3.0mm · 0.60mm/px · z∈[-69,+94]mm · 4 of 52 slices shown]
[im 1/52]
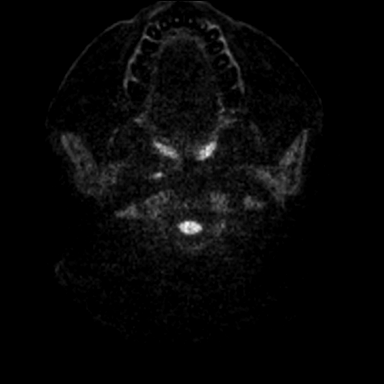
[im 18/52]
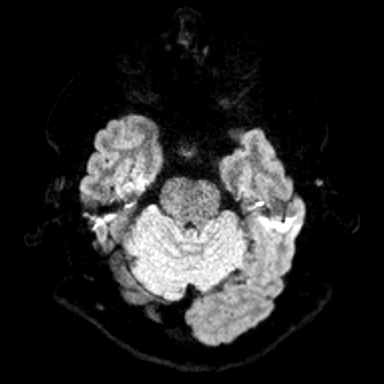
[im 35/52]
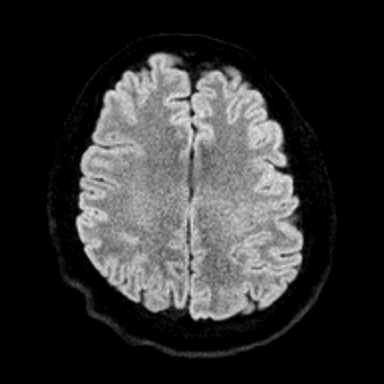
[im 52/52]
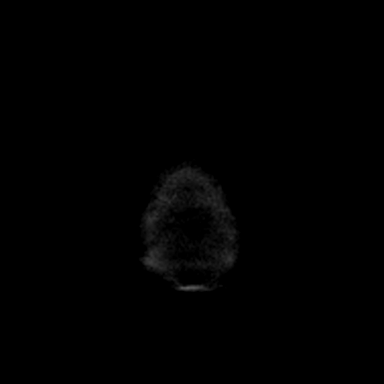

[Series 7: ax dwi_adc · axial · 3.0mm · 0.60mm/px · z∈[-69,-14]mm · 2 of 52 slices shown]
[im 1/52]
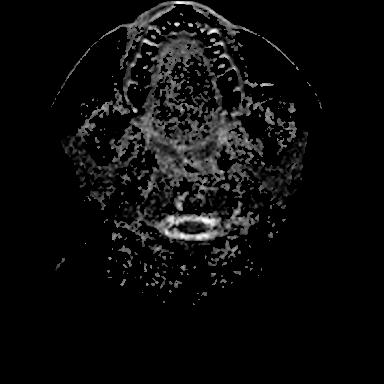
[im 18/52]
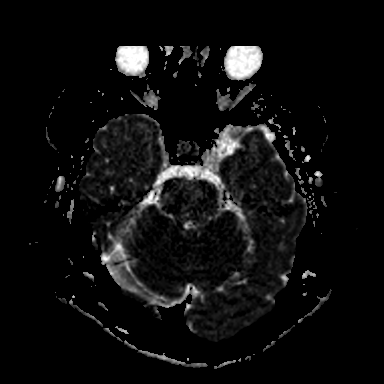

[Series 8: T2 · axial · 5.0mm · 0.53mm/px · z∈[-70,+93]mm · 2 of 29 slices shown]
[im 1/29]
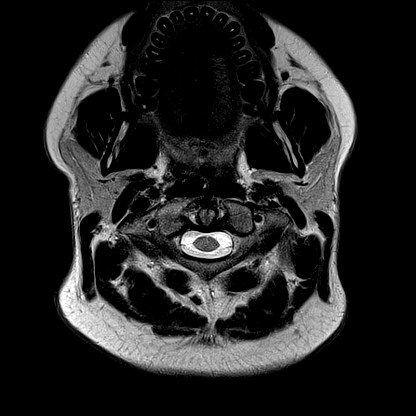
[im 29/29]
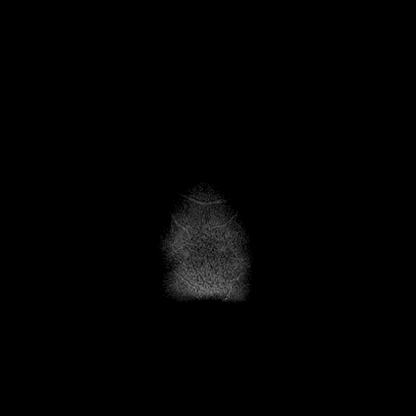

[Series 13: FLAIR · axial · 3.0mm · 0.53mm/px · z∈[-67,+90]mm · 4 of 55 slices shown]
[im 1/55]
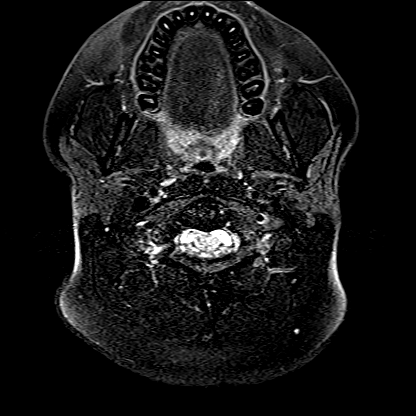
[im 19/55]
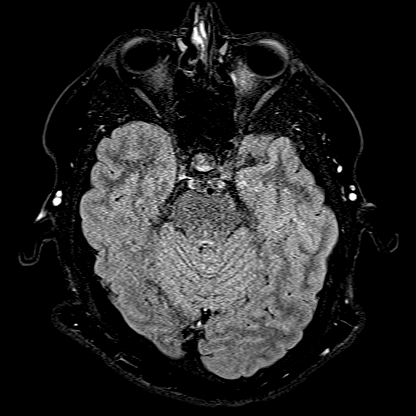
[im 37/55]
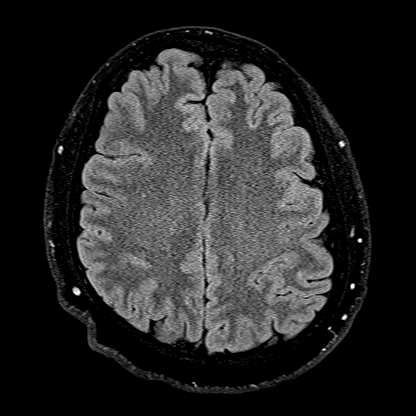
[im 55/55]
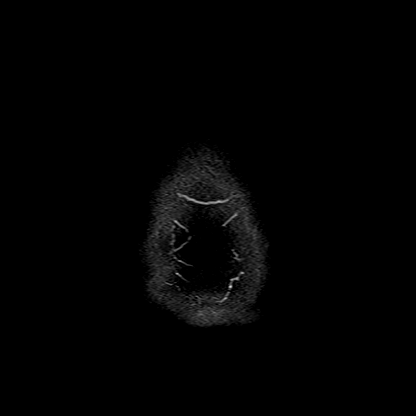

[Series 14: T1 · coronal · non-contrast · 3.0mm · 0.21mm/px · 1 of 13 slices shown (2 of 3)]
[im 1/13]
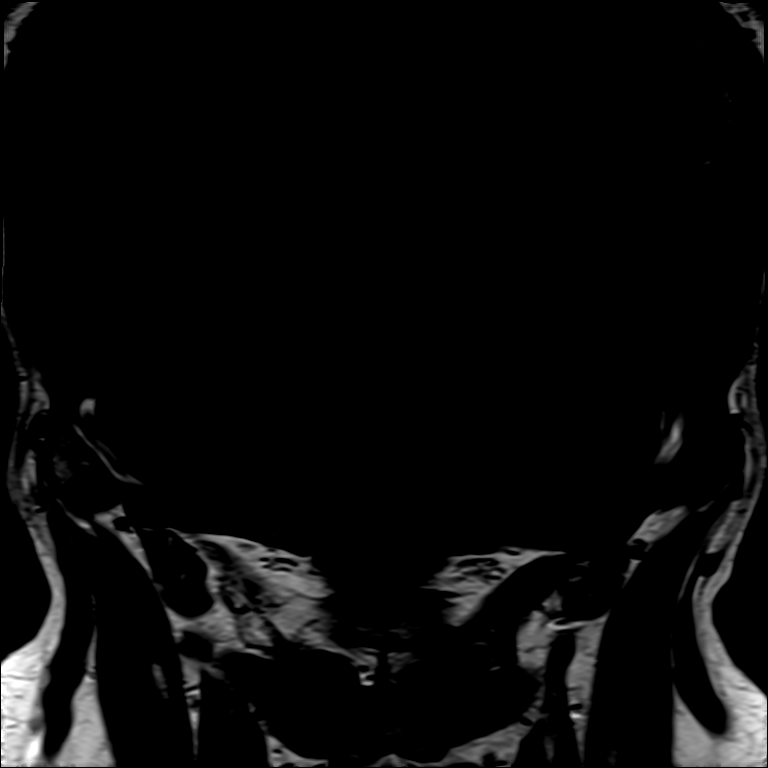

[Series 16: T1 · axial · non-contrast · 3.0mm · 0.21mm/px · 1 of 15 slices shown (3 of 3)]
[im 1/15]
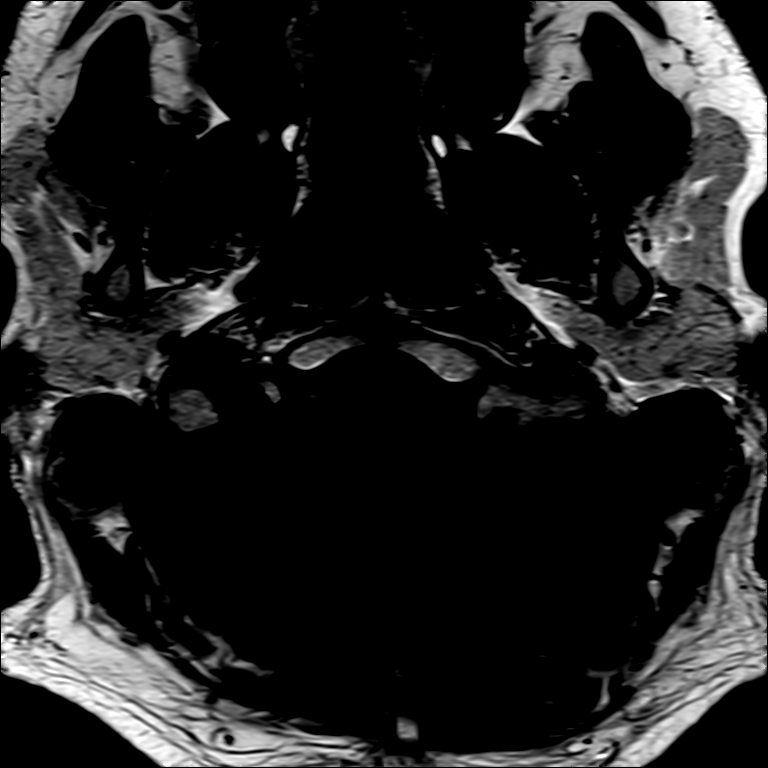

[Series 17: T1 post-contrast · axial · 3.0mm · 0.21mm/px · 1 of 15 slices shown (1 of 3)]
[im 1/15]
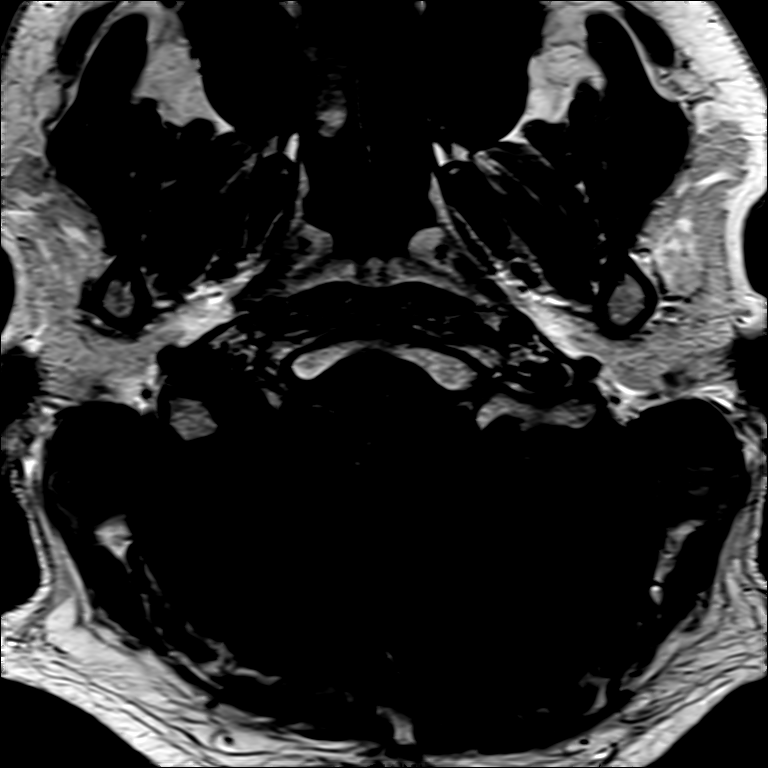

[Series 18: T1 post-contrast · coronal · 3.0mm · 0.21mm/px · 1 of 13 slices shown (2 of 3)]
[im 1/13]
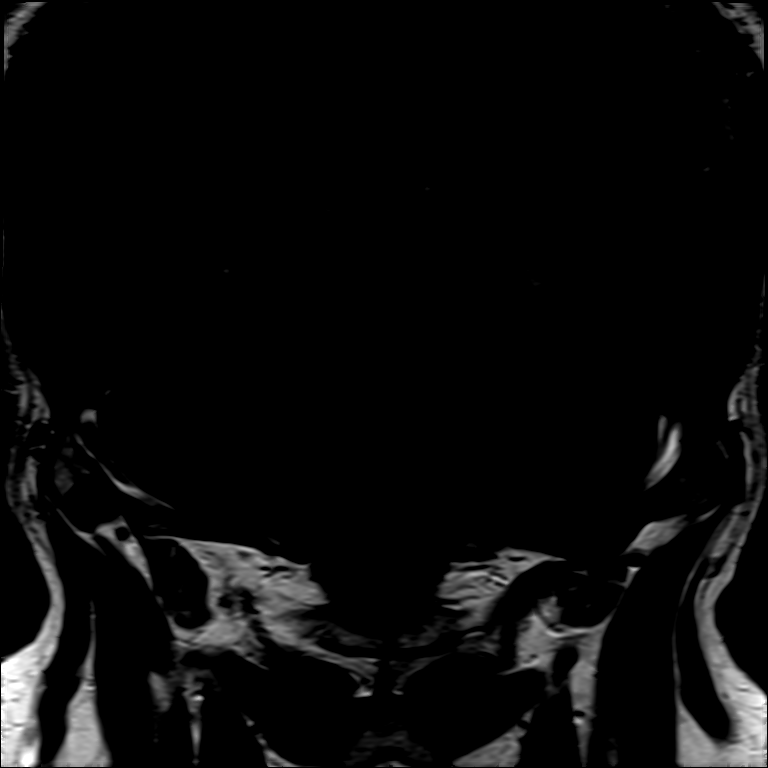

[Series 19: T1 post-contrast · axial · 1.0mm · 0.98mm/px · z∈[-69,+100]mm · 8 of 176 slices shown (3 of 3)]
[im 1/176]
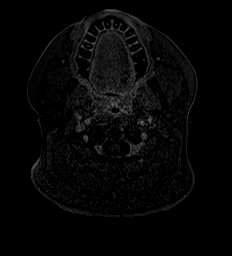
[im 27/176]
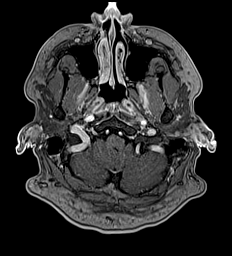
[im 54/176]
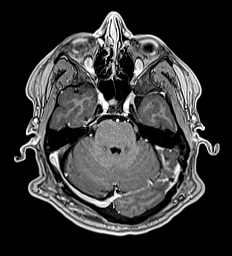
[im 81/176]
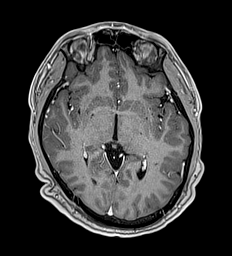
[im 95/176]
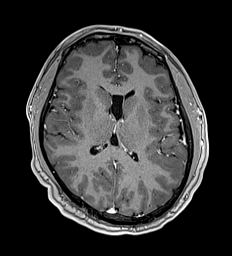
[im 122/176]
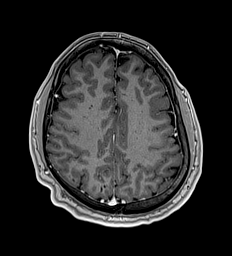
[im 149/176]
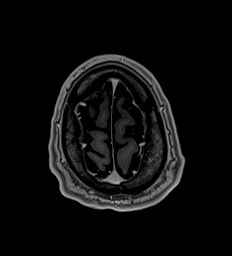
[im 176/176]
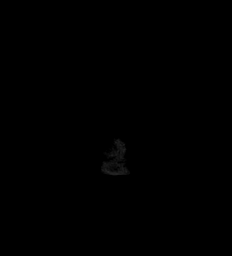

[27 of 48 positions shown; findings below may reference images not displayed]

FINDINGS: BRAIN: No acute infarct, acute hemorrhage or extra-axial collection.
Normal white matter signal. Normal volume of CSF spaces. No chronic
microhemorrhage. Normal midline structures. There is no
cerebellopontine angle mass. The cochleae and semicircular canals
are normal. No focal abnormality along the course of the 7th and 8th
cranial nerves. Normal porus acusticus and vestibular aqueduct
bilaterally.

VASCULAR: Major flow voids are preserved.

SKULL AND UPPER CERVICAL SPINE: Normal calvarium and skull base.
Visualized upper cervical spine and soft tissues are normal.

SINUSES/ORBITS: No paranasal sinus fluid levels or advanced mucosal
thickening. No mastoid or middle ear effusion. Normal orbits.
IMPRESSION: Normal MRI of the brain and internal auditory canals.

## 2019-10-05 MED ORDER — ALBUTEROL SULFATE (2.5 MG/3ML) 0.083% IN NEBU: 3.0000 mL | INHALATION_SOLUTION | RESPIRATORY_TRACT | Status: DC | PRN

## 2019-10-05 MED ORDER — ACETAMINOPHEN 650 MG RE SUPP: 650.0000 mg | RECTAL | Status: DC | PRN

## 2019-10-05 MED ORDER — ACETAMINOPHEN 160 MG/5ML PO SOLN
650.0000 mg | ORAL | Status: DC | PRN
  Filled 2019-10-05: qty 20.3

## 2019-10-05 MED ORDER — ENOXAPARIN SODIUM 40 MG/0.4ML ~~LOC~~ SOLN
40.0000 mg | Freq: Two times a day (BID) | SUBCUTANEOUS | Status: DC
  Filled 2019-10-05: qty 0.4

## 2019-10-05 MED ORDER — GADOBUTROL 1 MMOL/ML IV SOLN
10.0000 mL | Freq: Once | INTRAVENOUS | Status: AC | PRN
  Administered 2019-10-06: 10 mL via INTRAVENOUS

## 2019-10-05 MED ORDER — ENALAPRIL MALEATE 10 MG PO TABS
10.0000 mg | ORAL_TABLET | Freq: Every day | ORAL | Status: DC
  Administered 2019-10-06: 10 mg via ORAL
  Filled 2019-10-05: qty 1

## 2019-10-05 MED ORDER — SODIUM CHLORIDE 0.9 % IV SOLN: INTRAVENOUS | Status: DC

## 2019-10-05 MED ORDER — ACETAMINOPHEN 325 MG PO TABS: 650.0000 mg | ORAL_TABLET | ORAL | Status: DC | PRN

## 2019-10-05 MED ORDER — LORAZEPAM 2 MG/ML IJ SOLN
1.0000 mg | Freq: Once | INTRAMUSCULAR | Status: AC
  Administered 2019-10-05: 1 mg via INTRAVENOUS
  Filled 2019-10-05: qty 1

## 2019-10-05 MED ORDER — SODIUM CHLORIDE 0.9% FLUSH
3.0000 mL | Freq: Once | INTRAVENOUS | Status: DC
Start: 2019-10-05 — End: 2019-10-06

## 2019-10-05 MED ORDER — SENNOSIDES-DOCUSATE SODIUM 8.6-50 MG PO TABS: 1.0000 | ORAL_TABLET | Freq: Every evening | ORAL | Status: DC | PRN

## 2019-10-05 MED ORDER — ONDANSETRON HCL 4 MG/2ML IJ SOLN
4.0000 mg | Freq: Once | INTRAMUSCULAR | Status: AC
  Administered 2019-10-05: 4 mg via INTRAVENOUS
  Filled 2019-10-05: qty 2

## 2019-10-05 MED ORDER — ASPIRIN 300 MG RE SUPP
300.0000 mg | Freq: Every day | RECTAL | Status: DC
  Filled 2019-10-05: qty 1

## 2019-10-05 MED ORDER — STROKE: EARLY STAGES OF RECOVERY BOOK: Freq: Once | Status: AC

## 2019-10-05 MED ORDER — ADULT MULTIVITAMIN W/MINERALS CH
1.0000 | ORAL_TABLET | Freq: Every day | ORAL | Status: DC
  Administered 2019-10-06: 1 via ORAL
  Filled 2019-10-05: qty 1

## 2019-10-05 MED ORDER — ASPIRIN EC 325 MG PO TBEC
325.0000 mg | DELAYED_RELEASE_TABLET | Freq: Every day | ORAL | Status: DC
  Administered 2019-10-06: 325 mg via ORAL
  Filled 2019-10-05: qty 1

## 2019-10-05 NOTE — ED Notes (Addendum)
Pt reports nausea, and "warm rushing feeling throughout body". Pt states he is having a hard time catching his breath and states it may be attributed to anxiety.  EDP aware, see MAR.

## 2019-10-05 NOTE — Consult Note (Signed)
[facility] Tele[facility]urology Consult Services   TeleStroke Metrics: LKW: 0240 Door Time: 2106  [facility] Contacted: 2141  [facility] at Bedside: 2144 NIHSS: 2150 mRS: 0  Decision on Alteplase: Patient has declined IV alteplase administration due to concerns of internal bleeding.  He has opted for a more conservative approach. Interventional Candidate: Not a candidate as his symptoms are not consistent with a large vessel proximal occlusion.   Chief Complaint: Right-sided weakness and numbness   HPI: Asked to see this patient in emergent telemedicine consultation utilizing interactive audio and video technologies. Consultation was performed with assistance of ancillary / medical staff at bedside. Verbal consent to perform the examination with telemedicine was obtained. Patient agreed to proceed with the consultation for acute stroke protocol.  24 year old right-handed white male who comes to the emergency room for evaluation of right-sided weakness and numbness this evening.  Patient does not take any blood thinners or aspirin.  He has never had a stroke before.  He denies any history for migraine headaches as well.  He did recently recover from an episode of right-sided Bell's palsy in March 2021.  Patient states that over the last several days, he has had intermittent right arm numbness and tingling.  Sometimes his right arm numbness would spread into his right leg.  He has not identified any provoking factors.  He states his right sided numbness would last for about 1 to 2 hours at a time.  He denied any associated neck pain.  Then the patient states that as he got home from work around 7:30 PM, he felt acutely hot all over.  He then noted again right arm numbness but this time it spread into the right side of his face and into the back part of his right occipital region.  He then felt a heaviness on his right arm and right leg.  He did report a mild posterior headache on the  right side that he scores as 4/10 on pain scale.  No associated photophobia or nausea.  He still has no neck pain.  Head CT was negative.  I reviewed with the patient about the availability of IV alteplase.  I reviewed with him about some of the potential side effects of IV alteplase to include an approximate 4 to 6% risk of symptomatic intracranial hemorrhage, internal bleeding, and/or angioedema.  I also reviewed with him about some of the potential benefits of IV alteplase to include an approximate 30% chance of improvement at 3 months with the medication versus an approximate 20% chance of improvement at 3 months without the medication.  At the end of the day, patient has declined IV alteplase administration due to concerns of internal bleeding.  He has opted for a more conservative approach.   PMH: Hypertension and right Bell's palsy in March 2021   SOC: Negative x2.  Patient drinks occasional alcohol.  He lives with family.   [place]: Negative for stroke.  Positive for cancer and hyperlipidemia.   ROS: 13 point review systems were reviewed with the patient, and are all negative with the exception of the aforementioned in the history of present illness.   VS: Temperature 98.2 F, pulse 67, respiration 18, blood pressure 162/95, oxygen saturation 97%   Exam: Patient is in no apparent distress.  Patient appears as stated age.  No obvious acute respiratory or cardiac distress.  Patient is well groomed and well-nourished. 1a- LOC: Keenly responsive - 0 1b- LOC questions: Answers both questions correctly - 0 1c- LOC commands- Performs  both tasks correctly- 0 2- Gaze: Normal; no gaze paresis or gaze deviation - 0 3- Visual Fields: normal, no Visual field deficit - 0 4- Facial movements: right facial palsy - 1 5- Upper limb motor - right arm drift - 1 6- Lower limb motor - right leg drift - 1 7- Limb Coordination: absent ataxia - 0 8- Sensory: Right facial sensory loss - 1 9- Language - No aphasia  - 0 10- Speech - No dysarthria -0 11- Neglect / Extinction - none found - 0 NIHSS score: 4   Diagnostic Data: CT head showed no acute intracranial hemorrhage, mass, or large territory stroke  WBC 10, hemoglobin 16.3, platelets 268  Medical Data Reviewed: 1.Data?reviewed include clinical labs, radiology,?and medical tests; 2.Tests?results discussed w/performing or interpreting physician; 3.Obtaining/reviewing old medical records; 4.Obtaining?case history from another source; 5.Independent?review of image, tracing, or specimen.   Medical Decision Making: - Extensive number of diagnosis or management options are considered below. - Extensive amount of complex data reviewed. - High risk of complication and/or morbidity or mortality are associated with differential diagnostic considerations below. - There may be?uncertain?outcome and increased probability of prolonged functional impairment or high probability of severe prolonged functional impairment associated with some of these differential diagnosis.   Differential Diagnosis for Stroke: 1.?Cardioembolic?stroke 2. Small vessel disease/lacune 3. Thromboembolic, artery-to-artery mechanism 4.?Hypercoagulable?state-related infarct 5. Transient ischemic attack 6. Thrombotic mechanism, large artery disease   Assessment: 1.  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  Hypertension 3.  Recent right Bell's palsy in March 2021  Recommendations: Patient can be admitted to the hospital for further work-up of his symptoms Consult inpatient neurology team to assist with evaluation and management Start the patient on full dose aspirin Allow permissive hypertension over the next 48 hours Check MRI brain with and without contrast to rule out any acute intracranial process or demyelinating process Check MRA of the head and neck to better evaluate his intracranial and extracranial blood vessels Check echocardiogram to gauge his cardiac function Maintain the  patient on telemetry to look for paroxysmal atrial fibrillation Check hemoglobin A1c, lipid panel, urine drug screen, ESR and C-reactive protein Consult PT, OT, and ST Continue supportive care Plan of care was discussed with the patient   Thank you for allowing [facility] to participate in the care of your patient. Please call me, Dr. Dale [place], with any questions at [phone]. Case discussed with the ER staff.  Unit secretary states that the ER attendings are both attending to critical patients.  They will call back if they have any further questions.   Critical Care notation:   I was called to see this critical patient emergently. I personally evaluated this critical patient for acute stroke evaluation, and determining their eligibility for IV Alteplase and interventional therapies.  I have spent approximately 12 minutes with the patient, including time at bedside, time discussing the case with other physicians, reviewing plan of care, and time independently reviewing the records and scans.

## 2019-10-05 NOTE — ED Notes (Signed)
Admitting MD bedside.  MRI bedside to transport pt.

## 2019-10-05 NOTE — ED Provider Notes (Signed)
ER provider Note       Time seen: 10:40 PM    I have reviewed the vital signs and the nursing notes.  HISTORY   Chief Complaint Code Stroke    HPI Vincent Solis is a 24 y.o. male with a history of hypertension, obesity, seasonal allergy who presents today for right-sided numbness and weakness and involve his face, arm and leg that started about an hour ago.  Patient was brought in as a code stroke.  He denies any recent illness or other complaints.  Past Medical History:  Diagnosis Date  . Hypertension    being observed at present- will re evaluate 6 months after gastric sleeve surgery.  . Low testosterone   . Obesity   . PONV (postoperative nausea and vomiting)   . Seasonal allergies     Past Surgical History:  Procedure Laterality Date  . APPENDECTOMY     Laparoscopic -age 67  . LAPAROSCOPIC GASTRIC SLEEVE RESECTION N/A 08/04/2015   Procedure: LAPAROSCOPIC GASTRIC SLEEVE RESECTION;  Surgeon: Johnathan Hausen, MD;  Location: [facility];  Service: General;  Laterality: N/A;  . UPPER GI ENDOSCOPY N/A 08/04/2015   Procedure: UPPER GI ENDOSCOPY;  Surgeon: Johnathan Hausen, MD;  Location: [facility];  Service: General;  Laterality: N/A;    Allergies Sulfa antibiotics   Review of Systems Constitutional: Negative for fever. Cardiovascular: Negative for chest pain. Respiratory: Negative for shortness of breath. Gastrointestinal: Negative for abdominal pain, vomiting and diarrhea. Musculoskeletal: Negative for back pain. Skin: Negative for rash. Neurological: Positive for numbness and weakness in the right arm or right leg, numbness in the right face  All systems negative/normal/unremarkable except as stated in the HPI  ____________________________________________   PHYSICAL EXAM:  VITAL SIGNS: Vitals:   10/05/19 2118 10/05/19 2145  BP: (!) 162/95 (!) 146/108  Pulse: 67 72  Resp: 18 16  Temp: 98.2 F (36.8 C)   SpO2: 97% 99%    Constitutional: Alert and oriented. Well  appearing and in no distress. Eyes: Conjunctivae are normal. Normal extraocular movements. ENT      Head: Normocephalic and atraumatic.      Nose: No congestion/rhinnorhea.      Mouth/Throat: Mucous membranes are moist.      Neck: No stridor. Cardiovascular: Normal rate, regular rhythm. No murmurs, rubs, or gallops. Respiratory: Normal respiratory effort without tachypnea nor retractions. Breath sounds are clear and equal bilaterally. No wheezes/rales/rhonchi. Gastrointestinal: Soft and nontender. Normal bowel sounds Musculoskeletal: Nontender with normal range of motion in extremities. No lower extremity tenderness nor edema. Neurologic:  Normal speech and language. No gross focal neurologic deficits are appreciated.  Skin:  Skin is warm, dry and intact. No rash noted. Psychiatric: Speech and behavior are normal.  ____________________________________________   LABS (pertinent positives/negatives)  Labs Reviewed  APTT - Abnormal; Notable for the following components:      Result Value   aPTT 42 (*)    All other components within normal limits  COMPREHENSIVE METABOLIC PANEL - Abnormal; Notable for the following components:   Glucose, Bld 128 (*)    Total Bilirubin 1.3 (*)    All other components within normal limits  PROTIME-INR  CBC  DIFFERENTIAL  CBG MONITORING, ED  I-STAT CREATININE, ED    RADIOLOGY  Images were viewed by me IMPRESSION: 1. Negative CT head 2. ASPECTS is 10 3. These results were called by telephone at the time of interpretation on 10/05/2019 at 9:38 pm to provider MARK QUALE , who verbally acknowledged these  results.  DIFFERENTIAL DIAGNOSIS  CVA, TIA, anxiety, conversion disorder  ASSESSMENT AND PLAN  Numbness, weakness   Plan: The patient had presented for sided numbness and weakness. Patient's labs were unremarkable as was his CT head.  I will further discuss with neurology.   Daryel November MD    Note: This note was generated in part or  whole with voice recognition software. Voice recognition is usually quite accurate but there are transcription errors that can and very often do occur. I apologize for any typographical errors that were not detected and corrected.     Emily Filbert, MD 10/05/19 2244

## 2019-10-05 NOTE — ED Triage Notes (Signed)
Patient with complaint of right side numbness and weakness to face, arm and leg that started about an hour ago.

## 2019-10-06 ENCOUNTER — Observation Stay: Payer: BC Managed Care – PPO

## 2019-10-06 ENCOUNTER — Observation Stay (HOSPITAL_BASED_OUTPATIENT_CLINIC_OR_DEPARTMENT_OTHER)
Admit: 2019-10-06 | Discharge: 2019-10-06 | Disposition: A | Discharge status: Home / Self Care | Payer: BC Managed Care – PPO | Attending: Family Medicine | Admitting: Family Medicine

## 2019-10-06 ENCOUNTER — Encounter: Payer: Self-pay | Admitting: Family Medicine

## 2019-10-06 DIAGNOSIS — G459 Transient cerebral ischemic attack, unspecified: Secondary | ICD-10-CM | POA: Diagnosis not present

## 2019-10-06 DIAGNOSIS — I10 Essential (primary) hypertension: Secondary | ICD-10-CM | POA: Diagnosis not present

## 2019-10-06 DIAGNOSIS — R531 Weakness: Secondary | ICD-10-CM | POA: Diagnosis not present

## 2019-10-06 DIAGNOSIS — R200 Anesthesia of skin: Secondary | ICD-10-CM | POA: Diagnosis not present

## 2019-10-06 LAB — COMPREHENSIVE METABOLIC PANEL
ALT: 37 U/L (ref 0–44)
AST: 24 U/L (ref 15–41)
Albumin: 4.4 g/dL (ref 3.5–5.0)
Alkaline Phosphatase: 67 U/L (ref 38–126)
Anion gap: 9 (ref 5–15)
BUN: 13 mg/dL (ref 6–20)
CO2: 24 mmol/L (ref 22–32)
Calcium: 9 mg/dL (ref 8.9–10.3)
Chloride: 105 mmol/L (ref 98–111)
Creatinine, Ser: 0.51 mg/dL — ABNORMAL LOW (ref 0.61–1.24)
GFR calc Af Amer: >60 mL/min (ref >60)
GFR calc non Af Amer: >60 mL/min (ref >60)
Glucose, Bld: 98 mg/dL (ref 70–99)
Potassium: 4.1 mmol/L (ref 3.5–5.1)
Sodium: 138 mmol/L (ref 135–145)
Total Bilirubin: 1.3 mg/dL — ABNORMAL HIGH (ref 0.3–1.2)
Total Protein: 7.1 g/dL (ref 6.5–8.1)

## 2019-10-06 LAB — C-REACTIVE PROTEIN: CRP: 0.9 mg/dL (ref <1.0)

## 2019-10-06 LAB — CBC
HCT: 45.6 % (ref 39.0–52.0)
Hemoglobin: 15.7 g/dL (ref 13.0–17.0)
MCH: 29.2 pg (ref 26.0–34.0)
MCHC: 34.4 g/dL (ref 30.0–36.0)
MCV: 84.9 fL (ref 80.0–100.0)
Platelets: 268 10*3/uL (ref 150–400)
RBC: 5.37 MIL/uL (ref 4.22–5.81)
RDW: 12.1 % (ref 11.5–15.5)
WBC: 10.2 10*3/uL (ref 4.0–10.5)
nRBC: 0 % (ref 0.0–0.2)

## 2019-10-06 LAB — ECHOCARDIOGRAM COMPLETE
Height: 76.5 in
Weight: 5120 oz

## 2019-10-06 LAB — LIPID PANEL
Cholesterol: 139 mg/dL (ref 0–200)
HDL: 30 mg/dL — ABNORMAL LOW (ref >40)
LDL Cholesterol: 95 mg/dL (ref 0–99)
Total CHOL/HDL Ratio: 4.6 RATIO
Triglycerides: 71 mg/dL (ref <150)
VLDL: 14 mg/dL (ref 0–40)

## 2019-10-06 LAB — HIV ANTIBODY (ROUTINE TESTING W REFLEX): HIV Screen 4th Generation wRfx: NONREACTIVE

## 2019-10-06 LAB — HEMOGLOBIN A1C
Hgb A1c MFr Bld: 5.1 % (ref 4.8–5.6)
Mean Plasma Glucose: 99.67 mg/dL

## 2019-10-06 LAB — SEDIMENTATION RATE: Sed Rate: 1 mm/hr (ref 0–15)

## 2019-10-06 LAB — ANTITHROMBIN III: AntiThromb III Func: 103 % (ref 75–120)

## 2019-10-06 LAB — SARS CORONAVIRUS 2 BY RT PCR (HOSPITAL ORDER, PERFORMED IN ~~LOC~~ HOSPITAL LAB): SARS Coronavirus 2: NEGATIVE

## 2019-10-06 IMAGING — CT CT ANGIO HEAD
1 of 10 series · 6 of 33 positions shown · IV contrast (APPLIED)
Comparison: CT head and MRI head 10/05/2019

CLINICAL DATA: TIA.  Right facial numbness.

EXAM:
CT ANGIOGRAPHY HEAD AND NECK
TECHNIQUE: Multidetector CT imaging of the head and neck was performed using
the standard protocol during bolus administration of intravenous
contrast. Multiplanar CT image reconstructions and MIPs were
obtained to evaluate the vascular anatomy. Carotid stenosis
measurements (when applicable) are obtained utilizing NASCET
criteria, using the distal internal carotid diameter as the
denominator.
CONTRAST:  75mL OMNIPAQUE IOHEXOL 350 MG/ML SOLN

[Series 12: ax thin · axial · 0.54mm/px · z∈[-334,-62]mm · 6 of 382 slices shown]
[im 55/382  soft-tissue]
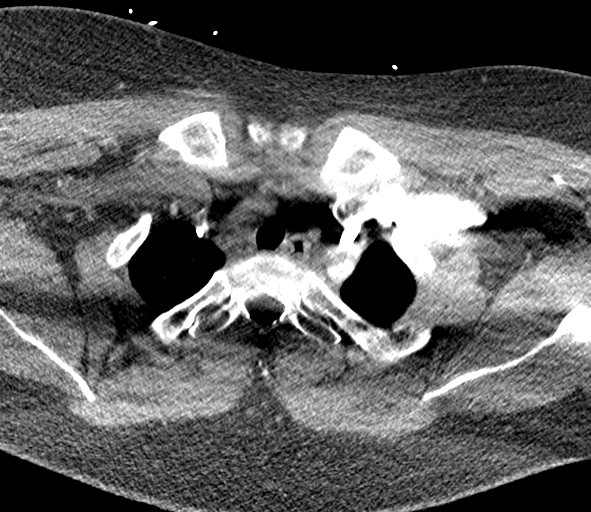
[im 109/382  bone]
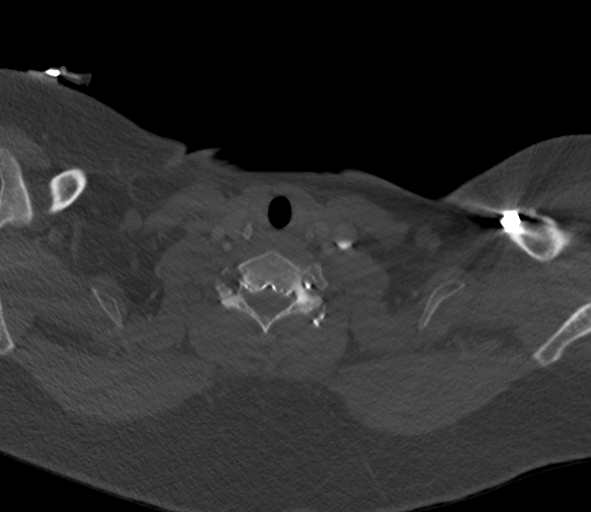
[im 164/382  soft-tissue]
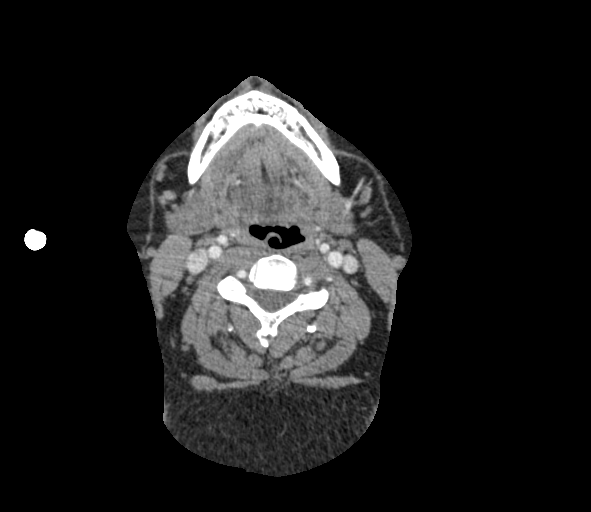
[im 218/382  bone]
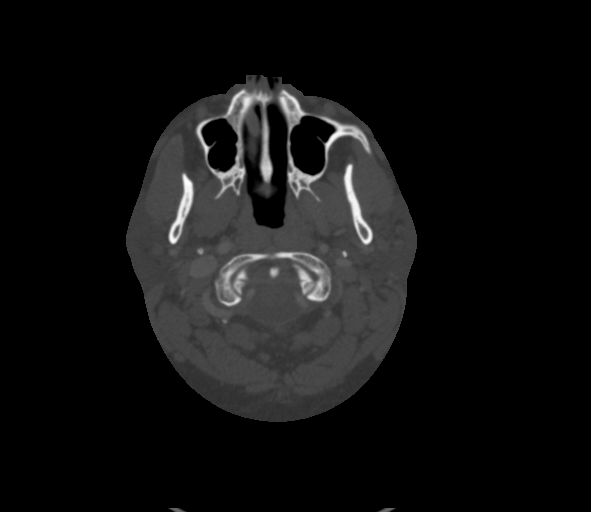
[im 273/382  soft-tissue]
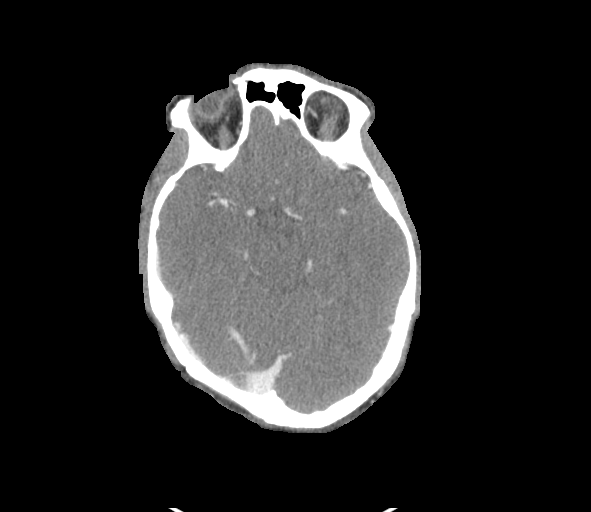
[im 327/382  bone]
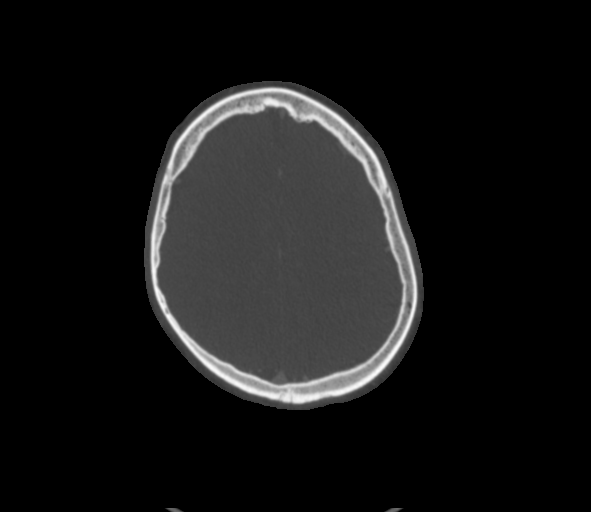

[6 of 33 positions shown; findings below may reference images not displayed]

FINDINGS: CT HEAD FINDINGS

Brain: No evidence of acute infarction, hemorrhage, hydrocephalus,
extra-axial collection or mass lesion/mass effect.

Vascular: Negative for hyperdense vessel

Skull: No focal skeletal abnormality. Flattening of the right
parietal bone.

Sinuses: Clear

Orbits: Negative

Review of the MIP images confirms the above findings

CTA NECK FINDINGS

Aortic arch: Standard branching. Imaged portion shows no evidence of
aneurysm or dissection. No significant stenosis of the major arch
vessel origins.

Right carotid system: Normal right carotid. No stenosis or
dissection

Left carotid system: Normal left carotid. No distant oasis or
dissection

Vertebral arteries: Both vertebral arteries are normal and patent to
the basilar.

Skeleton: No focal skeletal lesion.

Other neck: No soft tissue mass or adenopathy in the neck.

Upper chest: Lung apices clear bilaterally.

Review of the MIP images confirms the above findings

CTA HEAD FINDINGS

Anterior circulation: Cavernous carotid patent bilaterally without
stenosis. Anterior and middle cerebral arteries patent bilaterally
without stenosis or occlusion.

Posterior circulation: Both vertebral arteries patent to the
basilar. PICA not visualized bilaterally. AICA patent bilaterally.
Basilar widely patent. Superior cerebellar and posterior cerebral
arteries patent bilaterally without stenosis.

Venous sinuses: Normal venous enhancement

Anatomic variants: None

Review of the MIP images confirms the above findings
IMPRESSION: 1. Normal CT head
2. Normal CTA head and neck

## 2019-10-06 IMAGING — CT CT ANGIO NECK
2 of 11 series · 7 of 33 positions shown · IV contrast (APPLIED)
Comparison: CT head and MRI head 10/05/2019

CLINICAL DATA: TIA.  Right facial numbness.

EXAM:
CT ANGIOGRAPHY HEAD AND NECK
TECHNIQUE: Multidetector CT imaging of the head and neck was performed using
the standard protocol during bolus administration of intravenous
contrast. Multiplanar CT image reconstructions and MIPs were
obtained to evaluate the vascular anatomy. Carotid stenosis
measurements (when applicable) are obtained utilizing NASCET
criteria, using the distal internal carotid diameter as the
denominator.
CONTRAST:  75mL OMNIPAQUE IOHEXOL 350 MG/ML SOLN

[Series 518: cta head neck thins · axial · 0.52mm/px · z∈[-325,-71]mm · 5 of 764 slices shown]
[im 128/764  soft-tissue]
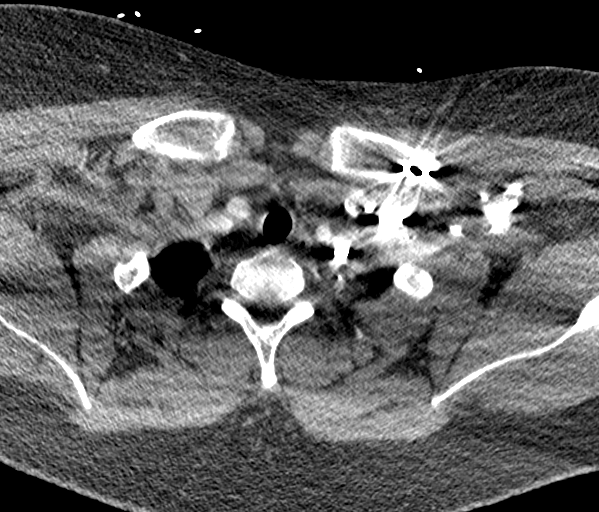
[im 255/764  bone]
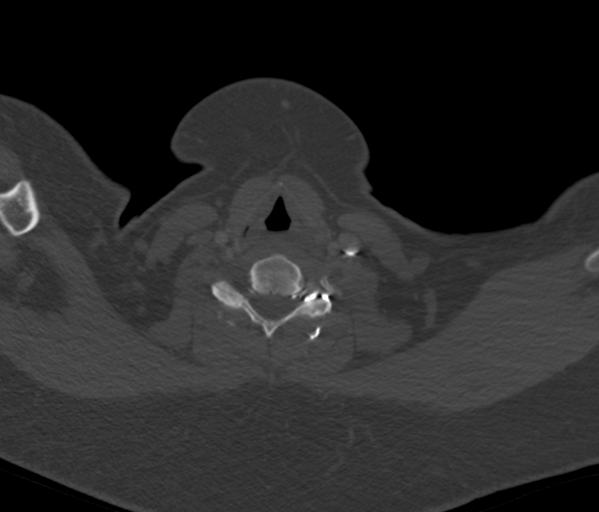
[im 382/764  soft-tissue]
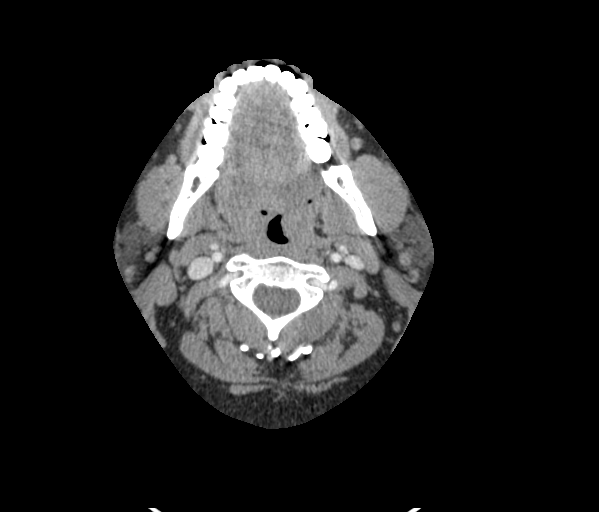
[im 509/764  bone]
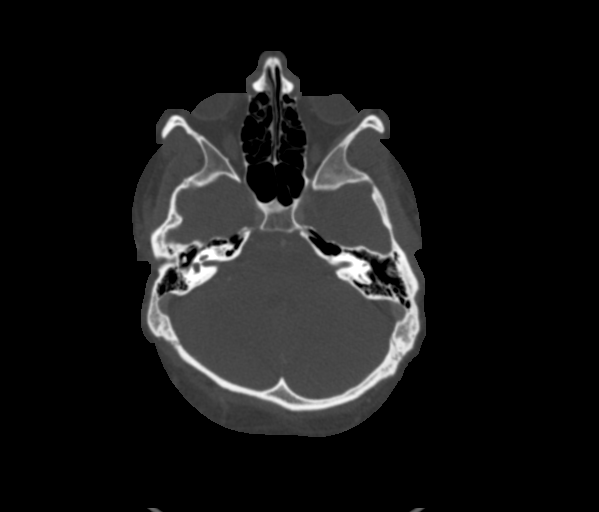
[im 636/764  soft-tissue]
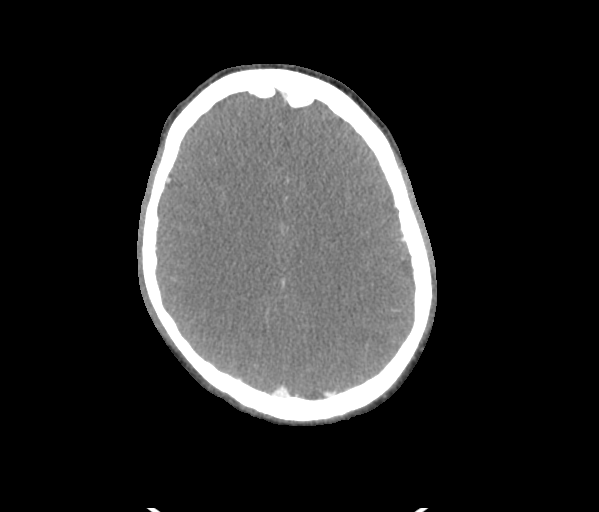

[Series 519: ax thin · axial · 0.54mm/px · z∈[-261,-134]mm · 2 of 382 slices shown]
[im 128/382  soft-tissue]
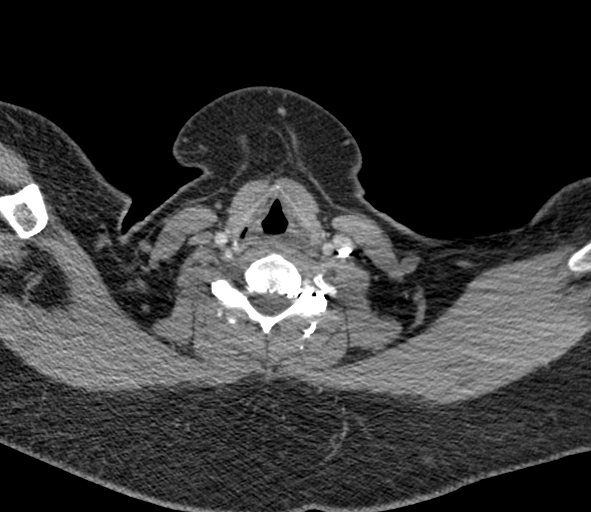
[im 255/382  soft-tissue]
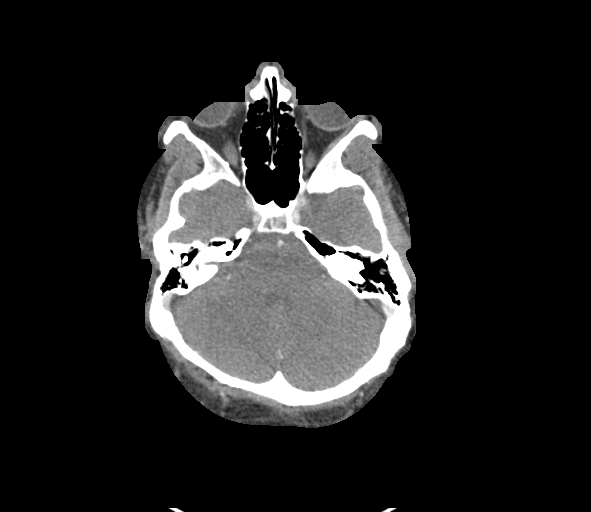

[7 of 33 positions shown; findings below may reference images not displayed]

FINDINGS: CT HEAD FINDINGS

Brain: No evidence of acute infarction, hemorrhage, hydrocephalus,
extra-axial collection or mass lesion/mass effect.

Vascular: Negative for hyperdense vessel

Skull: No focal skeletal abnormality. Flattening of the right
parietal bone.

Sinuses: Clear

Orbits: Negative

Review of the MIP images confirms the above findings

CTA NECK FINDINGS

Aortic arch: Standard branching. Imaged portion shows no evidence of
aneurysm or dissection. No significant stenosis of the major arch
vessel origins.

Right carotid system: Normal right carotid. No stenosis or
dissection

Left carotid system: Normal left carotid. No distant oasis or
dissection

Vertebral arteries: Both vertebral arteries are normal and patent to
the basilar.

Skeleton: No focal skeletal lesion.

Other neck: No soft tissue mass or adenopathy in the neck.

Upper chest: Lung apices clear bilaterally.

Review of the MIP images confirms the above findings

CTA HEAD FINDINGS

Anterior circulation: Cavernous carotid patent bilaterally without
stenosis. Anterior and middle cerebral arteries patent bilaterally
without stenosis or occlusion.

Posterior circulation: Both vertebral arteries patent to the
basilar. PICA not visualized bilaterally. AICA patent bilaterally.
Basilar widely patent. Superior cerebellar and posterior cerebral
arteries patent bilaterally without stenosis.

Venous sinuses: Normal venous enhancement

Anatomic variants: None

Review of the MIP images confirms the above findings
IMPRESSION: 1. Normal CT head
2. Normal CTA head and neck

## 2019-10-06 MED ORDER — IOHEXOL 350 MG/ML SOLN
75.0000 mL | Freq: Once | INTRAVENOUS | Status: AC | PRN
  Administered 2019-10-06: 75 mL via INTRAVENOUS

## 2019-10-06 MED ORDER — ATORVASTATIN CALCIUM 20 MG PO TABS
20.0000 mg | ORAL_TABLET | Freq: Every day | ORAL | Status: DC
  Administered 2019-10-06: 20 mg via ORAL
  Filled 2019-10-06: qty 1

## 2019-10-06 MED ORDER — ENOXAPARIN SODIUM 40 MG/0.4ML ~~LOC~~ SOLN: 40.0000 mg | SUBCUTANEOUS | Status: DC

## 2019-10-06 NOTE — Evaluation (Signed)
Physical Therapy Evaluation Patient Details Name: Vincent Solis MRN: [id number] DOB: November 20, 1995 Today's Date: 10/06/2019   History of Present Illness  Vincent Solis is an 24 y.o. male with a history of HTN who who presented to the emergency room with acute onset of right-sided numbness and weakness as well as right facial numbness that started at [place].  He admited to headache and mild dizziness denied any vertigo or tinnitus.  Reports that he has had RUE sensory changes for the past few days.  Did not seek medical attention.  At [place] noted that the numbness spread to his leg and right side of his face.  Felt he was dragging his right leg.  Presented for evaluation at that time.   He had COVID-19 in December of 2020 and Bell's palsy involving the right side that has resolved.  Clinical Impression  Pt is a pleasant 24 year old M who was admitted for suspected TIA with PMH of HTN, obesity, COVID (early 2021) and Bell's Palsy. Pt performs bed mobility, transfers, and ambulation independently without AD. Pt reports symptoms of numbness and weakness have resolved, with exception of mild numbness at R side face. Demos grossly 5/5 strength and sensation intact to light touch. Will DC from PT caseload at this time.      Follow Up Recommendations No PT follow up    Equipment Recommendations  None recommended by PT    Recommendations for Other Services       Precautions / Restrictions Precautions Precautions: None Restrictions Weight Bearing Restrictions: No      Mobility  Bed Mobility Overal bed mobility: Independent                Transfers Overall transfer level: Independent Equipment used: None                Ambulation/Gait Ambulation/Gait assistance: Independent Gait Distance (Feet): 250 Feet Assistive device: None       General Gait Details: Able to manage IV pole independently as well, though assisted to better assess normal gait pattern  Stairs             Wheelchair Mobility    Modified Rankin (Stroke Patients Only) Modified Rankin (Stroke Patients Only) Pre-Morbid Rankin Score: No symptoms Modified Rankin: No symptoms     Balance Overall balance assessment: No apparent balance deficits (not formally assessed)                                           Pertinent Vitals/Pain Pain Assessment: No/denies pain    Home Living Family/patient expects to be discharged to:: Private residence Living Arrangements: Parent Available Help at Discharge: Family Type of Home: House Home Access: Stairs to enter Entrance Stairs-Rails: Can reach both Entrance Stairs-Number of Steps: 3 Home Layout: Two level;Bed/bath upstairs Home Equipment: None      Prior Function Level of Independence: Independent               Hand Dominance        Extremity/Trunk Assessment   Upper Extremity Assessment Upper Extremity Assessment: Overall WFL for tasks assessed;RUE deficits/detail;LUE deficits/detail RUE Deficits / Details: Grossly 5/5, sensation intact to light touch LUE Deficits / Details: Grossly 5/5, sensation intact to light touch    Lower Extremity Assessment Lower Extremity Assessment: Overall WFL for tasks assessed;RLE deficits/detail;LLE deficits/detail RLE Deficits / Details: Grossly 5/5, sensation intact  to light touch LLE Deficits / Details: Grossly 5/5, sensation intact to light touch    Cervical / Trunk Assessment Cervical / Trunk Assessment: Normal  Communication   Communication: No difficulties  Cognition Arousal/Alertness: Awake/alert Behavior During Therapy: WFL for tasks assessed/performed Overall Cognitive Status: Within Functional Limits for tasks assessed                                 General Comments: Generally pleasant and willing to participate      General Comments      Exercises     Assessment/Plan    PT Assessment Patent does not need any further PT services  PT  Problem List         PT Treatment Interventions      PT Goals (Current goals can be found in the Care Plan section)  Acute Rehab PT Goals Patient Stated Goal: to go home PT Goal Formulation: With patient Time For Goal Achievement: 10/20/19 Potential to Achieve Goals: Good    Frequency     Barriers to discharge        Co-evaluation               AM-PAC PT "6 Clicks" Mobility  Outcome Measure Help needed turning from your back to your side while in a flat bed without using bedrails?: None Help needed moving from lying on your back to sitting on the side of a flat bed without using bedrails?: None Help needed moving to and from a bed to a chair (including a wheelchair)?: None   Help needed to walk in hospital room?: None Help needed climbing 3-5 steps with a railing? : None 6 Click Score: 20    End of Session Equipment Utilized During Treatment: Gait belt Activity Tolerance: Patient tolerated treatment well Patient left: in chair;with call bell/phone within reach Nurse Communication: Mobility status PT Visit Diagnosis: Other symptoms and signs involving the nervous system (R29.898)    Time: 1255-1310 PT Time Calculation (min) (ACUTE ONLY): 15 min   Charges:   PT Evaluation $PT Eval Low Complexity: 1 Low          Kendal Hymen, PT, DPT 10/06/19, 2:41 PM

## 2019-10-06 NOTE — ED Notes (Signed)
Pt back from MRI at this time

## 2019-10-06 NOTE — Consult Note (Signed)
Requesting Physician: Luberta Robertson    Chief Complaint: Right sided numbness and weakness  I have been asked by Dr. Luberta Robertson to see this patient in consultation for TIA.  HPI: Vincent Solis is an 24 y.o. male with a history of HTN who who presented to the emergency room with acute onset of right-sided numbness and weakness as well as right facial numbness that started at 1930.  He admited to headache and mild dizziness denied any vertigo or tinnitus.  Reports that he has had RUE sensory changes for the past few days.  Did not seek medical attention.  At 1930 noted that the numbness spread to his leg and right side of his face.  Felt he was dragging his right leg.  Presented for evaluation at that time.    He had COVID-19 in December of 2020 and Bell's palsy involving the right side that has resolved.  Initial NIHSS of 2.  Date last known well: Unable to determine Time last known well: Unable to determine tPA Given: No: Unable to determine LKW  Past Medical History:  Diagnosis Date  . Hypertension    being observed at present- will re evaluate 6 months after gastric sleeve surgery.  . Low testosterone   . Obesity   . PONV (postoperative nausea and vomiting)   . Seasonal allergies     Past Surgical History:  Procedure Laterality Date  . APPENDECTOMY     Laparoscopic -age 50  . LAPAROSCOPIC GASTRIC SLEEVE RESECTION N/A 08/04/2015   Procedure: LAPAROSCOPIC GASTRIC SLEEVE RESECTION;  Surgeon: Luretha Murphy, MD;  Location: [facility];  Service: General;  Laterality: N/A;  . UPPER GI ENDOSCOPY N/A 08/04/2015   Procedure: UPPER GI ENDOSCOPY;  Surgeon: Luretha Murphy, MD;  Location: [facility];  Service: General;  Laterality: N/A;    Family History  Problem Relation Age of Onset  . Mental illness Mother   . Hyperlipidemia Mother   . Hyperlipidemia Father   . GER disease Father   . Malignant hyperthermia Father   . Cancer Other   . Colitis Other   NO family history of stroke  Social History:   reports that he has never smoked. He has never used smokeless tobacco. He reports current alcohol use. He reports that he does not use drugs.  Allergies:  Allergies  Allergen Reactions  . Sulfa Antibiotics Rash    Medications:  I have reviewed the patient's current medications. Prior to Admission:  Medications Prior to Admission  Medication Sig Dispense Refill Last Dose  . albuterol (VENTOLIN HFA) 108 (90 Base) MCG/ACT inhaler TAKE 2 PUFFS BY MOUTH 4 TIMES A DAY AS NEEDED FOR WHEEZE 18 g 5   . doxycycline (VIBRA-TABS) 100 MG tablet Take one tablet po BID for 10 days 20 tablet 0   . enalapril (VASOTEC) 10 MG tablet Take 1 tablet (10 mg total) by mouth daily. 30 tablet 5   . Multiple Vitamin (MULTIVITAMIN WITH MINERALS) TABS tablet Take 1 tablet by mouth daily. Reported on 07/27/2015     . valACYclovir (VALTREX) 1000 MG tablet Take one tablet po TID for 7 days 14 tablet 0    Scheduled: . aspirin  300 mg Rectal Daily   Or  . aspirin EC  325 mg Oral Daily  . atorvastatin  20 mg Oral Daily  . enalapril  10 mg Oral Daily  . [START ON 10/07/2019] enoxaparin (LOVENOX) injection  40 mg Subcutaneous Q24H  . multivitamin with minerals  1 tablet Oral Daily  .  sodium chloride flush  3 mL Intravenous Once    ROS: History obtained from the patient  General ROS: negative for - chills, fatigue, fever, night sweats, weight gain or weight loss Psychological ROS: negative for - behavioral disorder, hallucinations, memory difficulties, mood swings or suicidal ideation Ophthalmic ROS: negative for - blurry vision, double vision, eye pain or loss of vision ENT ROS: negative for - epistaxis, nasal discharge, oral lesions, sore throat, tinnitus or vertigo Allergy and Immunology ROS: negative for - hives or itchy/watery eyes Hematological and Lymphatic ROS: negative for - bleeding problems, bruising or swollen lymph nodes Endocrine ROS: negative for - galactorrhea, hair pattern changes,  polydipsia/polyuria or temperature intolerance Respiratory ROS: negative for - cough, hemoptysis, shortness of breath or wheezing Cardiovascular ROS: negative for - chest pain, dyspnea on exertion, edema or irregular heartbeat Gastrointestinal ROS: negative for - abdominal pain, diarrhea, hematemesis, nausea/vomiting or stool incontinence Genito-Urinary ROS: negative for - dysuria, hematuria, incontinence or urinary frequency/urgency Musculoskeletal ROS: negative for - joint swelling or muscular weakness Neurological ROS: as noted in HPI Dermatological ROS: negative for rash and skin lesion changes  Physical Examination: Blood pressure (!) 144/89, pulse (!) 55, temperature 98.5 F (36.9 C), temperature source Oral, resp. rate 16, height 6' 4.5" (1.943 m), weight (!) 145.2 kg, SpO2 99 %.  HEENT-  Normocephalic, no lesions, without obvious abnormality.  Normal external eye and conjunctiva.  Normal TM's bilaterally.  Normal auditory canals and external ears. Normal external nose, mucus membranes and septum.  Normal pharynx. Cardiovascular- S1, S2 normal, pulses palpable throughout   Lungs- chest clear, no wheezing, rales, normal symmetric air entry Abdomen- soft, non-tender; bowel sounds normal; no masses,  no organomegaly Extremities- no edema Lymph-no adenopathy palpable Musculoskeletal-no joint tenderness, deformity or swelling Skin-warm and dry, no hyperpigmentation, vitiligo, or suspicious lesions  Neurological Examination   Mental Status: Alert, oriented, thought content appropriate.  Speech fluent without evidence of aphasia.  Able to follow 3 step commands without difficulty. Cranial Nerves: II: Visual fields grossly normal, pupils equal, round, reactive to light and accommodation III,IV, VI: ptosis not present, extra-ocular motions intact bilaterally V,VII: mild facial asymmetry, facial light touch sensation decreased on the lower aspect of the right side of the face VIII: hearing  normal bilaterally IX,X: gag reflex present XI: bilateral shoulder shrug XII: midline tongue extension Motor: Right : Upper extremity   5/5    Left:     Upper extremity   5/5  Lower extremity   5/5     Lower extremity   5/5 Tone and bulk:normal tone throughout; no atrophy noted Sensory: Pinprick and light touch intact throughout, bilaterally Deep Tendon Reflexes: 2+ and symmetric throughout Plantars: Right: mute   Left: downgoing Cerebellar: Normal finger-to-nose, normal rapid alternating movements and normal heel-to-shin testing bilaterally Gait: not tested due to safety concerns    Laboratory Studies:  Basic Metabolic Panel: Recent Labs  Lab 10/05/19 2140 10/06/19 0252  NA 137 138  K 3.8 4.1  CL 103 105  CO2 25 24  GLUCOSE 128* 98  BUN 15 13  CREATININE 0.64 0.51*  CALCIUM 9.3 9.0    Liver Function Tests: Recent Labs  Lab 10/05/19 2140 10/06/19 0252  AST 32 24  ALT 41 37  ALKPHOS 76 67  BILITOT 1.3* 1.3*  PROT 7.6 7.1  ALBUMIN 4.7 4.4   No results for input(s): LIPASE, AMYLASE in the last 168 hours. No results for input(s): AMMONIA in the last 168 hours.  CBC: Recent Labs  Lab 10/05/19 2140 10/06/19 0252  WBC 10.0 10.2  NEUTROABS 5.7  --   HGB 16.3 15.7  HCT 47.2 45.6  MCV 84.7 84.9  PLT 268 268    Cardiac Enzymes: No results for input(s): CKTOTAL, CKMB, CKMBINDEX, TROPONINI in the last 168 hours.  BNP: Invalid input(s): POCBNP  CBG: No results for input(s): GLUCAP in the last 168 hours.  Microbiology: Results for orders placed or performed during the hospital encounter of 10/05/19  SARS Coronavirus 2 by RT PCR (hospital order, performed in [facility] hospital lab) Nasopharyngeal Nasopharyngeal Swab     Status: None   Collection Time: 10/06/19  2:34 AM   Specimen: Nasopharyngeal Swab  Result Value Ref Range Status   SARS Coronavirus 2 NEGATIVE NEGATIVE Final    Comment: (NOTE) SARS-CoV-2 target nucleic acids are NOT DETECTED. The  SARS-CoV-2 RNA is generally detectable in upper and lower respiratory specimens during the acute phase of infection. The lowest concentration of SARS-CoV-2 viral copies this assay can detect is 250 copies / mL. A negative result does not preclude SARS-CoV-2 infection and should not be used as the sole basis for treatment or other patient management decisions.  A negative result may occur with improper specimen collection / handling, submission of specimen other than nasopharyngeal swab, presence of viral mutation(s) within the areas targeted by this assay, and inadequate number of viral copies (<250 copies / mL). A negative result must be combined with clinical observations, patient history, and epidemiological information. Fact Sheet for Patients:   BoilerBrush.com.cy Fact Sheet for Healthcare Providers: https://pope.com/ This test is not yet approved or cleared  by the [place] FDA and has been authorized for detection and/or diagnosis of SARS-CoV-2 by FDA under an Emergency Use Authorization (EUA).  This EUA will remain in effect (meaning this test can be used) for the duration of the COVID-19 declaration under Section 564(b)(1) of the Act, 21 U.S.C. section 360bbb-3(b)(1), unless the authorization is terminated or revoked sooner. Performed at [facility], [address] [place], [place] [address]     Coagulation Studies: Recent Labs    10/05/19 2140  LABPROT 12.7  INR 1.0    Urinalysis: No results for input(s): COLORURINE, LABSPEC, PHURINE, GLUCOSEU, HGBUR, BILIRUBINUR, KETONESUR, PROTEINUR, UROBILINOGEN, NITRITE, LEUKOCYTESUR in the last 168 hours.  Invalid input(s): APPERANCEUR  Lipid Panel:    Component Value Date/Time   CHOL 139 10/06/2019 0252   TRIG 71 10/06/2019 0252   HDL 30 (L) 10/06/2019 0252   CHOLHDL 4.6 10/06/2019 0252   VLDL 14 10/06/2019 0252   LDLCALC 95 10/06/2019 0252    HgbA1C:   Lab Results  Component Value Date   HGBA1C 5.1 10/06/2019    Urine Drug Screen:  No results found for: LABOPIA, COCAINSCRNUR, LABBENZ, AMPHETMU, THCU, LABBARB  Alcohol Level: No results for input(s): ETH in the last 168 hours.  Other results: EKG: sinus rhythm at 78 bpm.  Imaging: MR BRAIN/IAC W WO CONTRAST  Result Date: 10/06/2019 CLINICAL DATA:  Right-sided weakness and numbness EXAM: MRI HEAD WITHOUT AND WITH CONTRAST TECHNIQUE: Multiplanar, multiecho pulse sequences of the brain and surrounding structures were obtained without and with intravenous contrast. CONTRAST:  40mL GADAVIST GADOBUTROL 1 MMOL/ML IV SOLN COMPARISON:  None. FINDINGS: BRAIN: No acute infarct, acute hemorrhage or extra-axial collection. Normal white matter signal. Normal volume of CSF spaces. No chronic microhemorrhage. Normal midline structures. There is no cerebellopontine angle mass. The cochleae and semicircular canals are normal. No focal abnormality along the course of  the 7th and 8th cranial nerves. Normal porus acusticus and vestibular aqueduct bilaterally. VASCULAR: Major flow voids are preserved. SKULL AND UPPER CERVICAL SPINE: Normal calvarium and skull base. Visualized upper cervical spine and soft tissues are normal. SINUSES/ORBITS: No paranasal sinus fluid levels or advanced mucosal thickening. No mastoid or middle ear effusion. Normal orbits. IMPRESSION: Normal MRI of the brain and internal auditory canals. Electronically Signed   By: Ulyses Jarred M.D.   On: 10/06/2019 00:41   CT HEAD CODE STROKE WO CONTRAST  Result Date: 10/05/2019 CLINICAL DATA:  Code stroke. Acute neuro deficit. Right-sided numbness and weakness to face arm and leg. EXAM: CT HEAD WITHOUT CONTRAST TECHNIQUE: Contiguous axial images were obtained from the base of the skull through the vertex without intravenous contrast. COMPARISON:  None. FINDINGS: Brain: No evidence of acute infarction, hemorrhage, hydrocephalus, extra-axial collection  or mass lesion/mass effect. Vascular: Negative for hyperdense vessel Skull: Negative Sinuses/Orbits: Negative Other: None ASPECTS ([place] Stroke Program Early CT Score) - Ganglionic level infarction (caudate, lentiform nuclei, internal capsule, insula, M1-M3 cortex): 7 - Supraganglionic infarction (M4-M6 cortex): 3 Total score (0-10 with 10 being normal): 10 IMPRESSION: 1. Negative CT head 2. ASPECTS is 10 3. These results were called by telephone at the time of interpretation on 10/05/2019 at 9:38 pm to provider MARK QUALE , who verbally acknowledged these results. Electronically Signed   By: Franchot Gallo M.D.   On: 10/05/2019 21:38    Assessment: 24 y.o. male with a history of HTN presenting with complaints of right sided numbness.  BP elevated.  For the most part symptoms have resolved today other than some facial numbness that persists.  MRI of the brain with and without contrast personally reviewed and shows no acute changes.  Echocardiogram is pending.  A1c 5.1, LDL 95.   Differential for presentation includes cerebral vasculitis, hypertensive urgency, TIA, or complicated migraine since patient complained of headache as well.  With young age, TIA possibility is concerning .  Further work up recommended.    Stroke Risk Factors - hyperlipidemia and hypertension  Plan: 1. Statin for lipid management with target LDL<70. 2. Echocardiogram pending 3. CTA of the head and neck 4. Prophylactic therapy-ASA 81mg  daily 5. Telemetry monitoring 6. Frequent neuro checks 7. Hypercoagulation panel 8. BP control with target BP<140/80 9. Follow up with neurology on an outpatient basis   Alexis Goodell, MD Neurology [phone] 10/06/2019, 12:03 PM

## 2019-10-06 NOTE — ED Notes (Signed)
Care of pt assumed at this time

## 2019-10-06 NOTE — Plan of Care (Signed)
  Problem: Education: Goal: Knowledge of General Education information will improve Description: Including pain rating scale, medication(s)/side effects and non-pharmacologic comfort measures Outcome: Adequate for Discharge   Problem: Health Behavior/Discharge Planning: Goal: Ability to manage health-related needs will improve Outcome: Adequate for Discharge   Problem: Clinical Measurements: Goal: Ability to maintain clinical measurements within normal limits will improve Outcome: Adequate for Discharge Goal: Will remain free from infection Outcome: Adequate for Discharge Goal: Diagnostic test results will improve Outcome: Adequate for Discharge Goal: Respiratory complications will improve Outcome: Adequate for Discharge Goal: Cardiovascular complication will be avoided Outcome: Adequate for Discharge   Problem: Activity: Goal: Risk for activity intolerance will decrease Outcome: Adequate for Discharge   Problem: Nutrition: Goal: Adequate nutrition will be maintained Outcome: Adequate for Discharge   Problem: Coping: Goal: Level of anxiety will decrease Outcome: Adequate for Discharge   Problem: Elimination: Goal: Will not experience complications related to bowel motility Outcome: Adequate for Discharge Goal: Will not experience complications related to urinary retention Outcome: Adequate for Discharge   Problem: Pain Managment: Goal: General experience of comfort will improve Outcome: Adequate for Discharge   Problem: Safety: Goal: Ability to remain free from injury will improve Outcome: Adequate for Discharge   Problem: Skin Integrity: Goal: Risk for impaired skin integrity will decrease Outcome: Adequate for Discharge   Problem: Education: Goal: Knowledge of disease or condition will improve Outcome: Adequate for Discharge Goal: Knowledge of secondary prevention will improve Outcome: Adequate for Discharge Goal: Knowledge of patient specific risk factors  addressed and post discharge goals established will improve Outcome: Adequate for Discharge   Problem: Coping: Goal: Will verbalize positive feelings about self Outcome: Adequate for Discharge Goal: Will identify appropriate support needs Outcome: Adequate for Discharge   Problem: Health Behavior/Discharge Planning: Goal: Ability to manage health-related needs will improve Outcome: Adequate for Discharge   Problem: Self-Care: Goal: Ability to participate in self-care as condition permits will improve Outcome: Adequate for Discharge   Problem: Ischemic Stroke/TIA Tissue Perfusion: Goal: Complications of ischemic stroke/TIA will be minimized Outcome: Adequate for Discharge

## 2019-10-06 NOTE — H&P (Signed)
Vincent Solis at [place] NAME: Vincent Solis    MR#:  [id number]  DATE OF BIRTH:  1995/09/25  DATE OF ADMISSION:  10/05/2019  PRIMARY CARE PHYSICIAN: Mikey Kirschner, MD   REQUESTING/REFERRING PHYSICIAN: Delman Kitten, MD  CHIEF COMPLAINT:   Chief Complaint  Patient presents with  . Code Stroke    HISTORY OF PRESENT ILLNESS:  Vincent Solis  is a 24 y.o. Caucasian male with a known history of hypertension, obesity and seasonal allergies, who presented to the emergency room with acute onset of right-sided numbness and weakness as well as right facial numbness that started at 7:30 PM.  He admits to headache and mild dizziness denied any vertigo or tinnitus.  No witnessed seizures.  No chest pain or palpitations.  No recent travels or surgeries.  He denied any fever or chills.  He had COVID-19 earlier this year and Bell's palsy that has resolved.  He denied any urinary or stool incontinence.  No dysuria, oliguria or hematuria or flank pain.  No recent travels.  No dysarthria or dysphagia.  No facial droop. The patient's symptoms have significantly improved in the emergency room with residual right facial numbness.  Upon presentation to the emergency room, blood pressure was 162/95 with otherwise normal vital signs.  Later blood pressure normalized.  Labs revealed unremarkable CMP and CBC.  COVID-19 PCR came back negative. EKG showed normal sinus rhythm with rate of 78 with nonspecific intraventricular conduction delay and T wave inversion laterally.  Brain MRI without contrast came back normal.  The patient was given 1 mg of IV Ativan and 4 mg IV Zofran.  He will be placed in observation in a medically monitored bed for further evaluation and monitoring.   PAST MEDICAL HISTORY:   Past Medical History:  Diagnosis Date  . Hypertension    being observed at present- will re evaluate 6 months after gastric sleeve surgery.  . Low testosterone   . Obesity   . PONV  (postoperative nausea and vomiting)   . Seasonal allergies     PAST SURGICAL HISTORY:   Past Surgical History:  Procedure Laterality Date  . APPENDECTOMY     Laparoscopic -age 67  . LAPAROSCOPIC GASTRIC SLEEVE RESECTION N/A 08/04/2015   Procedure: LAPAROSCOPIC GASTRIC SLEEVE RESECTION;  Surgeon: Johnathan Hausen, MD;  Location: [facility];  Service: General;  Laterality: N/A;  . UPPER GI ENDOSCOPY N/A 08/04/2015   Procedure: UPPER GI ENDOSCOPY;  Surgeon: Johnathan Hausen, MD;  Location: [facility];  Service: General;  Laterality: N/A;    SOCIAL HISTORY:   Social History   Tobacco Use  . Smoking status: Never Smoker  . Smokeless tobacco: Never Used  Substance Use Topics  . Alcohol use: Yes    Comment: rare social    FAMILY HISTORY:   Family History  Problem Relation Age of Onset  . Mental illness Mother   . Hyperlipidemia Mother   . Hyperlipidemia Father   . GER disease Father   . Malignant hyperthermia Father   . Cancer Other   . Colitis Other     DRUG ALLERGIES:   Allergies  Allergen Reactions  . Sulfa Antibiotics Rash    REVIEW OF SYSTEMS:   ROS As per history of present illness. All pertinent systems were reviewed above. Constitutional,  HEENT, cardiovascular, respiratory, GI, GU, musculoskeletal, neuro, psychiatric, endocrine,  integumentary and hematologic systems were reviewed and are otherwise  negative/unremarkable except for positive findings mentioned above in the HPI.  MEDICATIONS AT HOME:   Prior to Admission medications   Medication Sig Start Date End Date Taking? Authorizing Provider  albuterol (VENTOLIN HFA) 108 (90 Base) MCG/ACT inhaler TAKE 2 PUFFS BY MOUTH 4 TIMES A DAY AS NEEDED FOR WHEEZE 08/01/19   Merlyn Albert, MD  doxycycline (VIBRA-TABS) 100 MG tablet Take one tablet po BID for 10 days 08/01/19   Merlyn Albert, MD  enalapril (VASOTEC) 10 MG tablet Take 1 tablet (10 mg total) by mouth daily. 08/01/19   Merlyn Albert, MD  Multiple  Vitamin (MULTIVITAMIN WITH MINERALS) TABS tablet Take 1 tablet by mouth daily. Reported on 07/27/2015    [provider]  valACYclovir (VALTREX) 1000 MG tablet Take one tablet po TID for 7 days 08/01/19   Merlyn Albert, MD      VITAL SIGNS:  Blood pressure 129/69, pulse (!) 54, temperature 98.2 F (36.8 C), resp. rate 13, height 6' 4.5" (1.943 m), weight (!) 145.2 kg, SpO2 95 %.  PHYSICAL EXAMINATION:  Physical Exam  GENERAL:  24 y.o.-year-old Caucasian obese male patient lying in the bed with no acute distress.  EYES: Pupils equal, round, reactive to light and accommodation. No scleral icterus. Extraocular muscles intact.  HEENT: Head atraumatic, normocephalic. Oropharynx and nasopharynx clear.  NECK:  Supple, no jugular venous distention. No thyroid enlargement, no tenderness.  Carotid pulses 2+ bilaterally with no carotid bruits. LUNGS: Normal breath sounds bilaterally, no wheezing, rales,rhonchi or crepitation. No use of accessory muscles of respiration.  CARDIOVASCULAR: Regular rate and rhythm, S1, S2 normal. No murmurs, rubs, or gallops.  ABDOMEN: Soft, nondistended, nontender. Bowel sounds present. No organomegaly or mass.  EXTREMITIES: No pedal edema, cyanosis, or clubbing.  NEUROLOGIC: Cranial nerves II through XII are intact except for mild right facial numbness with diminished sensation to light touch. Muscle strength 5/5 in all extremities. Sensation intact otherwise. Gait not checked.  PSYCHIATRIC: The patient is alert and oriented x 3.  Normal affect and good eye contact. SKIN: No obvious rash, lesion, or ulcer.   LABORATORY PANEL:   CBC Recent Labs  Lab 10/06/19 0252  WBC 10.2  HGB 15.7  HCT 45.6  PLT 268   ------------------------------------------------------------------------------------------------------------------  Chemistries  Recent Labs  Lab 10/05/19 2140  NA 137  K 3.8  CL 103  CO2 25  GLUCOSE 128*  BUN 15  CREATININE 0.64  CALCIUM  9.3  AST 32  ALT 41  ALKPHOS 76  BILITOT 1.3*   ------------------------------------------------------------------------------------------------------------------  Cardiac Enzymes No results for input(s): TROPONINI in the last 168 hours. ------------------------------------------------------------------------------------------------------------------  RADIOLOGY:  MR BRAIN/IAC W WO CONTRAST  Result Date: 10/06/2019 CLINICAL DATA:  Right-sided weakness and numbness EXAM: MRI HEAD WITHOUT AND WITH CONTRAST TECHNIQUE: Multiplanar, multiecho pulse sequences of the brain and surrounding structures were obtained without and with intravenous contrast. CONTRAST:  65mL GADAVIST GADOBUTROL 1 MMOL/ML IV SOLN COMPARISON:  None. FINDINGS: BRAIN: No acute infarct, acute hemorrhage or extra-axial collection. Normal white matter signal. Normal volume of CSF spaces. No chronic microhemorrhage. Normal midline structures. There is no cerebellopontine angle mass. The cochleae and semicircular canals are normal. No focal abnormality along the course of the 7th and 8th cranial nerves. Normal porus acusticus and vestibular aqueduct bilaterally. VASCULAR: Major flow voids are preserved. SKULL AND UPPER CERVICAL SPINE: Normal calvarium and skull base. Visualized upper cervical spine and soft tissues are normal. SINUSES/ORBITS: No paranasal sinus fluid levels or advanced mucosal thickening. No mastoid or middle ear effusion. Normal orbits. IMPRESSION: Normal MRI  of the brain and internal auditory canals. Electronically Signed   By: Deatra Robinson M.D.   On: 10/06/2019 00:41   CT HEAD CODE STROKE WO CONTRAST  Result Date: 10/05/2019 CLINICAL DATA:  Code stroke. Acute neuro deficit. Right-sided numbness and weakness to face arm and leg. EXAM: CT HEAD WITHOUT CONTRAST TECHNIQUE: Contiguous axial images were obtained from the base of the skull through the vertex without intravenous contrast. COMPARISON:  None. FINDINGS: Brain:  No evidence of acute infarction, hemorrhage, hydrocephalus, extra-axial collection or mass lesion/mass effect. Vascular: Negative for hyperdense vessel Skull: Negative Sinuses/Orbits: Negative Other: None ASPECTS (Alberta Stroke Program Early CT Score) - Ganglionic level infarction (caudate, lentiform nuclei, internal capsule, insula, M1-M3 cortex): 7 - Supraganglionic infarction (M4-M6 cortex): 3 Total score (0-10 with 10 being normal): 10 IMPRESSION: 1. Negative CT head 2. ASPECTS is 10 3. These results were called by telephone at the time of interpretation on 10/05/2019 at 9:38 pm to provider MARK QUALE , who verbally acknowledged these results. Electronically Signed   By: Marlan Palau M.D.   On: 10/05/2019 21:38      IMPRESSION AND PLAN:   1.  Right-sided hemiparesis and numbness with right facial numbness, resolving with differential diagnosis including TIA and possibly conversion disorder. -The patient will be admitted to an observation medical monitored bed. -We will follow neuro checks every 4 hours for 24 hours. -We will place on aspirin for now. -We will check fasting lipids and add statin therapy. -PT evaluation will be obtained -Neurology evaluation will be obtained.  I notified Dr. Thad Ranger about the patient. -A 2D echo will be obtained in a.m.  2.  Hypertension. -We will continue his enalapril.  3.  DVT prophylaxis. -Subcutaneous Lovenox.   All the records are reviewed and case discussed with ED provider. The plan of care was discussed in details with the patient (and family). I answered all questions. The patient agreed to proceed with the above mentioned plan. Further management will depend upon hospital course.   CODE STATUS: Full code  Status is: Observation  The patient remains OBS appropriate and will d/c before 2 midnights.  Dispo: The patient is from: Home              Anticipated d/c is to: Home              Anticipated d/c date is: 1 day               Patient currently is not medically stable to d/c.   TOTAL TIME TAKING CARE OF THIS PATIENT: 55 minutes.    Hannah Beat M.D on 10/06/2019 at 3:25 AM  [facility]   From 7 PM-7 AM, contact night-coverage [email or web address]  CC: Primary care physician; Merlyn Albert, MD   Note: This dictation was prepared with Dragon dictation along with smaller phrase technology. Any transcriptional errors that result from this process are unintentional.

## 2019-10-06 NOTE — Progress Notes (Signed)
MD order received in [facility] to discharge pt home today; verbally reviewed AVS with pt, no new Rxs; no questions voiced at this time; pt's discharge pending arrival of his family [facility]; verbally instructed pt to let us know when they are here in order to discharge him to the [facility]

## 2019-10-08 LAB — HOMOCYSTEINE: Homocysteine: 9.6 umol/L (ref 0.0–14.5)

## 2019-10-08 LAB — PROTEIN S, TOTAL: Protein S Ag, Total: 57 % — ABNORMAL LOW (ref 60–150)

## 2019-10-08 LAB — PROTEIN S ACTIVITY: Protein S Activity: 71 % (ref 63–140)

## 2019-10-08 LAB — PROTEIN C ACTIVITY: Protein C Activity: 100 % (ref 73–180)

## 2019-10-08 NOTE — Discharge Summary (Signed)
Vincent Solis TZG:[id number] DOB: 07-14-1995 DOA: 10/05/2019  PCP: Erven Colla, DO  Admit date: 10/05/2019  Discharge date: 10/08/2019  Admitted From: Home   disposition: Home   Recommendations for Outpatient Follow-up:   Follow up with PCP in 1-2 weeks  Home Health: None Equipment/Devices: None Consultations: Neurology Discharge Condition: Improved CODE STATUS: Full Diet Recommendation: Heart Healthy low calorie  Diet Order            Diet general               Chief Complaint  Patient presents with  . Code Stroke     Brief history of present illness from the day of admission and additional interim summary    Vincent Solis  is a 24 y.o. Caucasian male with a known history of hypertension, obesity and seasonal allergies, who presented to the emergency room with acute onset of right-sided numbness and weakness as well as right facial numbness that started at 7:30 PM.  He admits to headache and mild dizziness denied any vertigo or tinnitus.  No witnessed seizures.  No chest pain or palpitations.  No recent travels or surgeries.  He denied any fever or chills.  He had COVID-19 earlier this year and Bell's palsy that has resolved.  He denied any urinary or stool incontinence.  No dysuria, oliguria or hematuria or flank pain.  No recent travels.  No dysarthria or dysphagia.  No facial droop. The patient's symptoms have significantly improved in the emergency room with residual right facial numbness.  Upon presentation to the emergency room, blood pressure was 162/95 with otherwise normal vital signs.  Later blood pressure normalized.  Labs revealed unremarkable CMP and CBC.  COVID-19 PCR came back negative. EKG showed normal sinus rhythm with rate of 78 with nonspecific intraventricular conduction delay and T wave  inversion laterally.  Brain MRI without contrast came back normal.                                                                  Hospital Course   Patient was admitted for stroke work-up and was seen by neurology.  Symptoms had completely resolved by the time he was seen.  Patient underwent extensive work-up including echocardiogram, MRI head, CTA of head and neck all of which were within normal limits.    I discussed possibility of anxiety related symptoms including conversion disorder with the patient.  He agreed that he was very anxious.  Notes he used to see a psychiatrist but had stopped seeing 1 3 to 4 years ago.  Patient states he will follow up with a psychiatrist as well. Patient stated he felt back to baseline and was happy to go home.  Discharge diagnosis     Active Problems:   TIA (transient ischemic  attack)    Discharge instructions    Discharge Instructions    Diet general   Complete by: As directed    Discharge instructions   Complete by: As directed    1.  All of your tests have come back looking good with no evidence of stroke. 2.  As we discussed, sometimes anxiety can look like a stroke.  I would recommend that you go back to see your psychiatrist or another therapist for management of anxiety. 3.  Follow-up with your PCP as needed.      Discharge Medications   Allergies as of 10/06/2019      Reactions   Sulfa Antibiotics Rash      Medication List    TAKE these medications   albuterol 108 (90 Base) MCG/ACT inhaler Commonly known as: Ventolin HFA TAKE 2 PUFFS BY MOUTH 4 TIMES A DAY AS NEEDED FOR WHEEZE   doxycycline 100 MG tablet Commonly known as: VIBRA-TABS Take one tablet po BID for 10 days   enalapril 10 MG tablet Commonly known as: VASOTEC Take 1 tablet (10 mg total) by mouth daily.   multivitamin with minerals Tabs tablet Take 1 tablet by mouth daily. Reported on 07/27/2015   valACYclovir 1000 MG tablet Commonly known as: VALTREX Take  one tablet po TID for 7 days         Major procedures and Radiology Reports - PLEASE review detailed and final reports thoroughly  -        CT ANGIO HEAD W OR WO CONTRAST  Result Date: 10/06/2019 CLINICAL DATA:  TIA.  Right facial numbness. EXAM: CT ANGIOGRAPHY HEAD AND NECK TECHNIQUE: Multidetector CT imaging of the head and neck was performed using the standard protocol during bolus administration of intravenous contrast. Multiplanar CT image reconstructions and MIPs were obtained to evaluate the vascular anatomy. Carotid stenosis measurements (when applicable) are obtained utilizing NASCET criteria, using the distal internal carotid diameter as the denominator. CONTRAST:  102mL OMNIPAQUE IOHEXOL 350 MG/ML SOLN COMPARISON:  CT head and MRI head 10/05/2019 FINDINGS: CT HEAD FINDINGS Brain: No evidence of acute infarction, hemorrhage, hydrocephalus, extra-axial collection or mass lesion/mass effect. Vascular: Negative for hyperdense vessel Skull: No focal skeletal abnormality. Flattening of the right parietal bone. Sinuses: Clear Orbits: Negative Review of the MIP images confirms the above findings CTA NECK FINDINGS Aortic arch: Standard branching. Imaged portion shows no evidence of aneurysm or dissection. No significant stenosis of the major arch vessel origins. Right carotid system: Normal right carotid. No stenosis or dissection Left carotid system: Normal left carotid. No distant oasis or dissection Vertebral arteries: Both vertebral arteries are normal and patent to the basilar. Skeleton: No focal skeletal lesion. Other neck: No soft tissue mass or adenopathy in the neck. Upper chest: Lung apices clear bilaterally. Review of the MIP images confirms the above findings CTA HEAD FINDINGS Anterior circulation: Cavernous carotid patent bilaterally without stenosis. Anterior and middle cerebral arteries patent bilaterally without stenosis or occlusion. Posterior circulation: Both vertebral arteries  patent to the basilar. PICA not visualized bilaterally. AICA patent bilaterally. Basilar widely patent. Superior cerebellar and posterior cerebral arteries patent bilaterally without stenosis. Venous sinuses: Normal venous enhancement Anatomic variants: None Review of the MIP images confirms the above findings IMPRESSION: 1. Normal CT head 2. Normal CTA head and neck Electronically Signed   By: Marlan Palau M.D.   On: 10/06/2019 17:28   CT ANGIO NECK W OR WO CONTRAST  Result Date: 10/06/2019 CLINICAL DATA:  TIA.  Right facial  numbness. EXAM: CT ANGIOGRAPHY HEAD AND NECK TECHNIQUE: Multidetector CT imaging of the head and neck was performed using the standard protocol during bolus administration of intravenous contrast. Multiplanar CT image reconstructions and MIPs were obtained to evaluate the vascular anatomy. Carotid stenosis measurements (when applicable) are obtained utilizing NASCET criteria, using the distal internal carotid diameter as the denominator. CONTRAST:  75mL OMNIPAQUE IOHEXOL 350 MG/ML SOLN COMPARISON:  CT head and MRI head 10/05/2019 FINDINGS: CT HEAD FINDINGS Brain: No evidence of acute infarction, hemorrhage, hydrocephalus, extra-axial collection or mass lesion/mass effect. Vascular: Negative for hyperdense vessel Skull: No focal skeletal abnormality. Flattening of the right parietal bone. Sinuses: Clear Orbits: Negative Review of the MIP images confirms the above findings CTA NECK FINDINGS Aortic arch: Standard branching. Imaged portion shows no evidence of aneurysm or dissection. No significant stenosis of the major arch vessel origins. Right carotid system: Normal right carotid. No stenosis or dissection Left carotid system: Normal left carotid. No distant oasis or dissection Vertebral arteries: Both vertebral arteries are normal and patent to the basilar. Skeleton: No focal skeletal lesion. Other neck: No soft tissue mass or adenopathy in the neck. Upper chest: Lung apices clear  bilaterally. Review of the MIP images confirms the above findings CTA HEAD FINDINGS Anterior circulation: Cavernous carotid patent bilaterally without stenosis. Anterior and middle cerebral arteries patent bilaterally without stenosis or occlusion. Posterior circulation: Both vertebral arteries patent to the basilar. PICA not visualized bilaterally. AICA patent bilaterally. Basilar widely patent. Superior cerebellar and posterior cerebral arteries patent bilaterally without stenosis. Venous sinuses: Normal venous enhancement Anatomic variants: None Review of the MIP images confirms the above findings IMPRESSION: 1. Normal CT head 2. Normal CTA head and neck Electronically Signed   By: Marlan Palauharles  Clark M.D.   On: 10/06/2019 17:28   MR BRAIN/IAC W WO CONTRAST  Result Date: 10/06/2019 CLINICAL DATA:  Right-sided weakness and numbness EXAM: MRI HEAD WITHOUT AND WITH CONTRAST TECHNIQUE: Multiplanar, multiecho pulse sequences of the brain and surrounding structures were obtained without and with intravenous contrast. CONTRAST:  10mL GADAVIST GADOBUTROL 1 MMOL/ML IV SOLN COMPARISON:  None. FINDINGS: BRAIN: No acute infarct, acute hemorrhage or extra-axial collection. Normal white matter signal. Normal volume of CSF spaces. No chronic microhemorrhage. Normal midline structures. There is no cerebellopontine angle mass. The cochleae and semicircular canals are normal. No focal abnormality along the course of the 7th and 8th cranial nerves. Normal porus acusticus and vestibular aqueduct bilaterally. VASCULAR: Major flow voids are preserved. SKULL AND UPPER CERVICAL SPINE: Normal calvarium and skull base. Visualized upper cervical spine and soft tissues are normal. SINUSES/ORBITS: No paranasal sinus fluid levels or advanced mucosal thickening. No mastoid or middle ear effusion. Normal orbits. IMPRESSION: Normal MRI of the brain and internal auditory canals. Electronically Signed   By: Deatra RobinsonKevin  Herman M.D.   On: 10/06/2019 00:41    ECHOCARDIOGRAM COMPLETE  Result Date: 10/06/2019    ECHOCARDIOGRAM REPORT   Patient Name:   Vincent Solis Date of Exam: 10/06/2019 Medical Rec #:  [id number]    Height:       76.5 in Accession #:    [id number]   Weight:       320.0 lb Date of Birth:  03-18-1996   BSA:          2.718 m Patient Age:    23 years     BP:           149/78 mmHg Patient Gender: M  HR:           44 bpm. Exam Location:  [facility] Procedure: 2D Echo Indications:     TIA  History:         Patient has no prior history of Echocardiogram examinations.                  Risk Factors:Hx of COVID19.  Sonographer:     Hilbert Corrigan Thornton-Maynard Referring Phys:  [id number] AVW A MANSY Diagnosing Phys: Lorine Bears MD IMPRESSIONS  1. Left ventricular ejection fraction, by estimation, is 50 to 55%. The left ventricle has low normal function. The left ventricle has no regional wall motion abnormalities. The left ventricular internal cavity size was mildly dilated. There is mild left ventricular hypertrophy. Left ventricular diastolic parameters were normal.  2. Right ventricular systolic function is normal. The right ventricular size is normal. Tricuspid regurgitation signal is inadequate for assessing PA pressure.  3. The mitral valve is normal in structure. No evidence of mitral valve regurgitation. No evidence of mitral stenosis.  4. The aortic valve is normal in structure. Aortic valve regurgitation is not visualized. No aortic stenosis is present. FINDINGS  Left Ventricle: Left ventricular ejection fraction, by estimation, is 50 to 55%. The left ventricle has low normal function. The left ventricle has no regional wall motion abnormalities. The left ventricular internal cavity size was mildly dilated. There is mild left ventricular hypertrophy. Left ventricular diastolic parameters were normal. Right Ventricle: The right ventricular size is normal. No increase in right ventricular wall thickness. Right ventricular systolic function is normal.  Tricuspid regurgitation signal is inadequate for assessing PA pressure. The tricuspid regurgitant velocity is 1.63 m/s, and with an assumed right atrial pressure of 10 mmHg, the estimated right ventricular systolic pressure is 20.6 mmHg. Left Atrium: Left atrial size was normal in size. Right Atrium: Right atrial size was normal in size. Pericardium: There is no evidence of pericardial effusion. Mitral Valve: The mitral valve is normal in structure. Normal mobility of the mitral valve leaflets. No evidence of mitral valve regurgitation. No evidence of mitral valve stenosis. Tricuspid Valve: The tricuspid valve is normal in structure. Tricuspid valve regurgitation is not demonstrated. No evidence of tricuspid stenosis. Aortic Valve: The aortic valve is normal in structure. Aortic valve regurgitation is not visualized. No aortic stenosis is present. Aortic valve peak gradient measures 6.0 mmHg. Pulmonic Valve: The pulmonic valve was normal in structure. Pulmonic valve regurgitation is not visualized. No evidence of pulmonic stenosis. Aorta: The aortic root is normal in size and structure. Venous: The inferior vena cava was not well visualized. IAS/Shunts: No atrial level shunt detected by color flow Doppler.  LEFT VENTRICLE PLAX 2D LVIDd:         5.29 cm  Diastology LVIDs:         3.93 cm  LV e' lateral:   11.40 cm/s LV PW:         1.17 cm  LV E/e' lateral: 8.4 LV IVS:        1.40 cm  LV e' medial:    7.18 cm/s LVOT diam:     2.50 cm  LV E/e' medial:  13.3 LV SV:         97 LV SV Index:   36 LVOT Area:     4.91 cm  RIGHT VENTRICLE RV S prime:     12.40 cm/s TAPSE (M-mode): 2.7 cm LEFT ATRIUM             Index  LA diam:        4.00 cm 1.47 cm/m LA Vol (A2C):   58.6 ml 21.56 ml/m LA Vol (A4C):   36.9 ml 13.58 ml/m LA Biplane Vol: 48.4 ml 17.81 ml/m  AORTIC VALVE AV Area (Vmax): 3.47 cm AV Vmax:        122.00 cm/s AV Peak Grad:   6.0 mmHg LVOT Vmax:      86.20 cm/s LVOT Vmean:     60.000 cm/s LVOT VTI:       0.198 m   AORTA Ao Root diam: 3.50 cm MITRAL VALVE               TRICUSPID VALVE MV Area (PHT): 1.93 cm    TR Peak grad:   10.6 mmHg MV E velocity: 95.50 cm/s  TR Vmax:        163.00 cm/s MV A velocity: 50.60 cm/s MV E/A ratio:  1.89        SHUNTS                            Systemic VTI:  0.20 m                            Systemic Diam: 2.50 cm Lorine Bears MD Electronically signed by Lorine Bears MD Signature Date/Time: 10/06/2019/4:29:12 PM    Final    CT HEAD CODE STROKE WO CONTRAST  Result Date: 10/05/2019 CLINICAL DATA:  Code stroke. Acute neuro deficit. Right-sided numbness and weakness to face arm and leg. EXAM: CT HEAD WITHOUT CONTRAST TECHNIQUE: Contiguous axial images were obtained from the base of the skull through the vertex without intravenous contrast. COMPARISON:  None. FINDINGS: Brain: No evidence of acute infarction, hemorrhage, hydrocephalus, extra-axial collection or mass lesion/mass effect. Vascular: Negative for hyperdense vessel Skull: Negative Sinuses/Orbits: Negative Other: None ASPECTS (Alberta Stroke Program Early CT Score) - Ganglionic level infarction (caudate, lentiform nuclei, internal capsule, insula, M1-M3 cortex): 7 - Supraganglionic infarction (M4-M6 cortex): 3 Total score (0-10 with 10 being normal): 10 IMPRESSION: 1. Negative CT head 2. ASPECTS is 10 3. These results were called by telephone at the time of interpretation on 10/05/2019 at 9:38 pm to provider MARK QUALE , who verbally acknowledged these results. Electronically Signed   By: Marlan Palau M.D.   On: 10/05/2019 21:38    Micro Results    Recent Results (from the past 240 hour(s))  SARS Coronavirus 2 by RT PCR (hospital order, performed in [facility] hospital lab) Nasopharyngeal Nasopharyngeal Swab     Status: None   Collection Time: 10/06/19  2:34 AM   Specimen: Nasopharyngeal Swab  Result Value Ref Range Status   SARS Coronavirus 2 NEGATIVE NEGATIVE Final    Comment: (NOTE) SARS-CoV-2 target nucleic acids  are NOT DETECTED. The SARS-CoV-2 RNA is generally detectable in upper and lower respiratory specimens during the acute phase of infection. The lowest concentration of SARS-CoV-2 viral copies this assay can detect is 250 copies / mL. A negative result does not preclude SARS-CoV-2 infection and should not be used as the sole basis for treatment or other patient management decisions.  A negative result may occur with improper specimen collection / handling, submission of specimen other than nasopharyngeal swab, presence of viral mutation(s) within the areas targeted by this assay, and inadequate number of viral copies (<250 copies / mL). A negative result must be combined with clinical observations, patient  history, and epidemiological information. Fact Sheet for Patients:   BoilerBrush.com.cy Fact Sheet for Healthcare Providers: https://pope.com/ This test is not yet approved or cleared  by the [place] FDA and has been authorized for detection and/or diagnosis of SARS-CoV-2 by FDA under an Emergency Use Authorization (EUA).  This EUA will remain in effect (meaning this test can be used) for the duration of the COVID-19 declaration under Section 564(b)(1) of the Act, 21 U.S.C. section 360bbb-3(b)(1), unless the authorization is terminated or revoked sooner. Performed at [facility], [address], [place], [place] [address]     Today   Subjective    Vincent Solis feels much improved since admission.  Feels ready to go home.  Denies chest pain, shortness of breath or abdominal pain.  Feels they can take care of themselves with the resources they have at home.  Objective   Blood pressure 127/66, pulse (!) 52, temperature 98.7 F (37.1 C), resp. rate 16, height 6' 4.5" (1.943 m), weight (!) 145.2 kg, SpO2 98 %.  No intake or output data in the 24 hours ending 10/08/19 1841  Exam General: Patient appears well and in  good spirits sitting up in bed in no acute distress.  Eyes: sclera anicteric, conjuctiva mild injection bilaterally CVS: S1-S2, regular  Respiratory:  decreased air entry bilaterally secondary to decreased inspiratory effort, rales at bases  GI: NABS, soft, NT  LE: No edema.  Neuro: A/O x 3, Moving all extremities equally with normal strength, CN 3-12 intact, grossly nonfocal.  Psych: patient is logical and coherent, judgement and insight appear normal, mood and affect appropriate to situation.    Data Review   CBC w Diff:  Lab Results  Component Value Date   WBC 10.2 10/06/2019   HGB 15.7 10/06/2019   HCT 45.6 10/06/2019   PLT 268 10/06/2019   LYMPHOPCT 30 10/05/2019   MONOPCT 9 10/05/2019   EOSPCT 4 10/05/2019   BASOPCT 0 10/05/2019    CMP:  Lab Results  Component Value Date   NA 138 10/06/2019   K 4.1 10/06/2019   CL 105 10/06/2019   CO2 24 10/06/2019   BUN 13 10/06/2019   CREATININE 0.51 (L) 10/06/2019   CREATININE 0.57 08/23/2013   PROT 7.1 10/06/2019   ALBUMIN 4.4 10/06/2019   BILITOT 1.3 (H) 10/06/2019   ALKPHOS 67 10/06/2019   AST 24 10/06/2019   ALT 37 10/06/2019  .   Total Time in preparing paper work, data evaluation and todays exam - 35 minutes  Pieter Partridge M.D on 10/08/2019 at 6:41 PM  [facility]   Office  [phone]

## 2019-10-09 LAB — PTT-LA INCUB MIX: PTT-LA Incub Mix: 54.2 s — ABNORMAL HIGH (ref 0.0–48.9)

## 2019-10-09 LAB — LUPUS ANTICOAGULANT PANEL
DRVVT: 39.9 s (ref 0.0–47.0)
PTT Lupus Anticoagulant: 52.5 s — ABNORMAL HIGH (ref 0.0–51.9)

## 2019-10-09 LAB — CARDIOLIPIN ANTIBODIES, IGG, IGM, IGA
Anticardiolipin IgA: <9 APL U/mL (ref 0–11)
Anticardiolipin IgG: <9 GPL U/mL (ref 0–14)
Anticardiolipin IgM: <9 MPL U/mL (ref 0–12)

## 2019-10-09 LAB — HEXAGONAL PHASE PHOSPHOLIPID: Hexagonal Phase Phospholipid: 2 s (ref 0–11)

## 2019-10-09 LAB — BETA-2-GLYCOPROTEIN I ABS, IGG/M/A
Beta-2 Glyco I IgG: <9 GPI IgG units (ref 0–20)
Beta-2-Glycoprotein I IgA: <9 GPI IgA units (ref 0–25)
Beta-2-Glycoprotein I IgM: <9 GPI IgM units (ref 0–32)

## 2019-10-09 LAB — PTT-LA MIX: PTT-LA Mix: 46.6 s (ref 0.0–48.9)

## 2019-10-09 LAB — PROTEIN C, TOTAL: Protein C, Total: 102 % (ref 60–150)

## 2019-10-10 ENCOUNTER — Ambulatory Visit: Payer: BC Managed Care – PPO | Admitting: Family Medicine

## 2019-10-10 ENCOUNTER — Other Ambulatory Visit: Payer: Self-pay

## 2019-10-10 ENCOUNTER — Encounter: Payer: Self-pay | Admitting: Family Medicine

## 2019-10-10 VITALS — BP 128/72 | HR 49 | Temp 96.6°F | Ht 76.5 in | Wt 330.4 lb

## 2019-10-10 DIAGNOSIS — F419 Anxiety disorder, unspecified: Secondary | ICD-10-CM | POA: Diagnosis not present

## 2019-10-10 DIAGNOSIS — G459 Transient cerebral ischemic attack, unspecified: Secondary | ICD-10-CM

## 2019-10-10 DIAGNOSIS — R200 Anesthesia of skin: Secondary | ICD-10-CM

## 2019-10-10 LAB — PROTHROMBIN GENE MUTATION

## 2019-10-10 LAB — FACTOR 5 LEIDEN

## 2019-10-10 MED ORDER — BUSPIRONE HCL 5 MG PO TABS: 5.0000 mg | ORAL_TABLET | Freq: Two times a day (BID) | ORAL | 0 refills | Status: DC
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
## 2019-10-14 ENCOUNTER — Other Ambulatory Visit: Payer: Self-pay | Admitting: Family Medicine

## 2019-10-14 DIAGNOSIS — R899 Unspecified abnormal finding in specimens from other organs, systems and tissues: Secondary | ICD-10-CM

## 2019-10-18 DIAGNOSIS — F321 Major depressive disorder, single episode, moderate: Secondary | ICD-10-CM | POA: Diagnosis not present

## 2019-10-25 ENCOUNTER — Encounter: Payer: Self-pay | Admitting: Family Medicine

## 2019-10-25 DIAGNOSIS — F321 Major depressive disorder, single episode, moderate: Secondary | ICD-10-CM | POA: Diagnosis not present

## 2019-10-30 ENCOUNTER — Other Ambulatory Visit: Payer: Self-pay | Admitting: Family Medicine

## 2019-10-30 DIAGNOSIS — R200 Anesthesia of skin: Secondary | ICD-10-CM

## 2019-10-30 DIAGNOSIS — F419 Anxiety disorder, unspecified: Secondary | ICD-10-CM

## 2019-11-05 ENCOUNTER — Inpatient Hospital Stay: Payer: BC Managed Care – PPO | Attending: Oncology | Admitting: Oncology

## 2019-11-05 ENCOUNTER — Other Ambulatory Visit: Payer: Self-pay

## 2019-11-05 ENCOUNTER — Inpatient Hospital Stay: Payer: BC Managed Care – PPO

## 2019-11-05 ENCOUNTER — Encounter: Payer: Self-pay | Admitting: Oncology

## 2019-11-05 VITALS — BP 128/80 | HR 54 | Temp 96.2°F | Resp 16 | Wt 329.2 lb

## 2019-11-05 DIAGNOSIS — Z8616 Personal history of COVID-19: Secondary | ICD-10-CM | POA: Insufficient documentation

## 2019-11-05 DIAGNOSIS — G459 Transient cerebral ischemic attack, unspecified: Secondary | ICD-10-CM | POA: Insufficient documentation

## 2019-11-05 DIAGNOSIS — D6862 Lupus anticoagulant syndrome: Secondary | ICD-10-CM | POA: Diagnosis not present

## 2019-11-05 DIAGNOSIS — R791 Abnormal coagulation profile: Secondary | ICD-10-CM

## 2019-11-05 DIAGNOSIS — E669 Obesity, unspecified: Secondary | ICD-10-CM

## 2019-11-05 DIAGNOSIS — Z79899 Other long term (current) drug therapy: Secondary | ICD-10-CM | POA: Insufficient documentation

## 2019-11-05 DIAGNOSIS — I10 Essential (primary) hypertension: Secondary | ICD-10-CM | POA: Diagnosis not present

## 2019-11-05 DIAGNOSIS — F419 Anxiety disorder, unspecified: Secondary | ICD-10-CM | POA: Diagnosis not present

## 2019-11-05 DIAGNOSIS — Z809 Family history of malignant neoplasm, unspecified: Secondary | ICD-10-CM | POA: Diagnosis not present

## 2019-11-05 DIAGNOSIS — Z8349 Family history of other endocrine, nutritional and metabolic diseases: Secondary | ICD-10-CM | POA: Diagnosis not present

## 2019-11-05 NOTE — Progress Notes (Signed)
Hematology/Oncology Consult note [facility] Telephone:([phone] [phone]   Patient Care Team: Erven Colla, DO as PCP - General (Family Medicine)  REFERRING PROVIDER: Erven Colla, DO  CHIEF COMPLAINTS/REASON FOR VISIT:  Evaluation of abnormal hypercoagulable work-up  HISTORY OF PRESENTING ILLNESS:   Vincent Solis is a  24 y.o.  male with PMH listed below was seen in consultation at the request of  Erven Colla, DO  for evaluation of abnormal hypercoagulable work-up  Patient recently presented emergency room on 10/05/2019 complaining right-sided numbness and weakness involving his face, arms, legs that started about an hour prior to the presentation to emergency room.  He was brought in as a code stroke.  Patient had a history of COVID-27 April 2019 and the Bell's palsy involving the right side that had resolved.  Initial NIHSS of 2.  Patient symptoms improved in the emergency room. Stroke work-up during his hospital stay include MRI brain with and without contrast showed no acute changes.  CTA of the head and neck negative.  EKG showed normal sinus rhythm.  Echocardiogram showed LVEF 50 to 55%.  Mild left ventricular hypertrophy.  Otherwise nonremarkable.  A1c 5.1, LDL 95, differential for presentation includes TIA possibility, complicated migraine, hypertensive urgency, cerebral vasculitis versus anxiety.  Patient was seen and evaluated by neurology and was recommended for aspirin 81 mg daily.  Hypercoagulable panel was recommended by neurology and was obtained during his stay. Other possibilities include anxiety related symptoms including conversion disorders were considered.  Patient later provides history of feeling anxious and used to see psychiatrist and currently not seeing any. Patient was discharged with a discharge diagnosis of TIA.  Hypercoagulable work-up as follows Negative factor V Leiden mutation, prothrombin mutation.  Normal  homocystine level Normal beta glycoprotein IgG/M/A Normal cardiolipin antibodies IgG/M/A- Slightly decreased total protein S antigen, normal protein S activity Normal total protein C level and activity. 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  PTT LA incubation mix showed prolonged level again.  Patient denies any personal history of thrombosis or family history of thrombosis.  Today he feels well. Denies constitutional symptoms Review of Systems  Constitutional: Negative for appetite change, chills, fatigue, fever and unexpected weight change.  HENT:   Negative for hearing loss and voice change.   Eyes: Negative for eye problems and icterus.  Respiratory: Negative for chest tightness, cough and shortness of breath.   Cardiovascular: Negative for chest pain and leg swelling.  Gastrointestinal: Negative for abdominal distention and abdominal pain.  Endocrine: Negative for hot flashes.  Genitourinary: Negative for difficulty urinating, dysuria and frequency.   Musculoskeletal: Negative for arthralgias.  Skin: Negative for itching and rash.  Neurological: Negative for light-headedness and numbness.  Hematological: Negative for adenopathy. Does not bruise/bleed easily.  Psychiatric/Behavioral: Negative for confusion.    MEDICAL HISTORY:  Past Medical History:  Diagnosis Date  . Hypertension    being observed at present- will re evaluate 6 months after gastric sleeve surgery.  . Low testosterone   . Obesity   . PONV (postoperative nausea and vomiting)   . Seasonal allergies     SURGICAL HISTORY: Past Surgical History:  Procedure Laterality Date  . APPENDECTOMY     Laparoscopic -age 56  . LAPAROSCOPIC GASTRIC SLEEVE RESECTION N/A 08/04/2015   Procedure: LAPAROSCOPIC GASTRIC SLEEVE RESECTION;  Surgeon: Johnathan Hausen, MD;  Location: [facility];  Service: General;  Laterality: N/A;  .  UPPER GI ENDOSCOPY N/A 08/04/2015    Procedure: UPPER GI ENDOSCOPY;  Surgeon: Johnathan Hausen, MD;  Location: [facility];  Service: General;  Laterality: N/A;    SOCIAL HISTORY: Social History   Socioeconomic History  . Marital status: Single    Spouse name: Not on file  . Number of children: Not on file  . Years of education: Not on file  . Highest education level: Not on file  Occupational History  . Not on file  Tobacco Use  . Smoking status: Never Smoker  . Smokeless tobacco: Never Used  Vaping Use  . Vaping Use: Never used  Substance and Sexual Activity  . Alcohol use: Yes    Comment: rare social  . Drug use: No  . Sexual activity: Not on file  Other Topics Concern  . Not on file  Social History Narrative  . Not on file   Social Determinants of Health   Financial Resource Strain:   . Difficulty of Paying Living Expenses:   Food Insecurity:   . Worried About Charity fundraiser in the Last Year:   . Arboriculturist in the Last Year:   Transportation Needs:   . Film/video editor (Medical):   Marland Kitchen Lack of Transportation (Non-Medical):   Physical Activity:   . Days of Exercise per Week:   . Minutes of Exercise per Session:   Stress:   . Feeling of Stress :   Social Connections:   . Frequency of Communication with Friends and Family:   . Frequency of Social Gatherings with Friends and Family:   . Attends Religious Services:   . Active Member of Clubs or Organizations:   . Attends Archivist Meetings:   Marland Kitchen Marital Status:   Intimate Partner Violence:   . Fear of Current or Ex-Partner:   . Emotionally Abused:   Marland Kitchen Physically Abused:   . Sexually Abused:     FAMILY HISTORY: Family History  Problem Relation Age of Onset  . Mental illness Mother   . Hyperlipidemia Mother   . Melanoma Mother   . Hyperlipidemia Father   . GER disease Father   . Malignant hyperthermia Father   . Cancer Other   . Colitis Other     ALLERGIES:  is allergic to sulfa antibiotics.  MEDICATIONS:    Current Outpatient Medications  Medication Sig Dispense Refill  . albuterol (VENTOLIN HFA) 108 (90 Base) MCG/ACT inhaler TAKE 2 PUFFS BY MOUTH 4 TIMES A DAY AS NEEDED FOR WHEEZE 18 g 5  . busPIRone (BUSPAR) 5 MG tablet TAKE 1 TABLET BY MOUTH TWICE A DAY 60 tablet 0  . enalapril (VASOTEC) 10 MG tablet Take 1 tablet (10 mg total) by mouth daily. 30 tablet 5  . Multiple Vitamin (MULTIVITAMIN WITH MINERALS) TABS tablet Take 1 tablet by mouth daily. Reported on 07/27/2015     No current facility-administered medications for this visit.     PHYSICAL EXAMINATION: ECOG PERFORMANCE STATUS: 0 - Asymptomatic Vitals:   11/05/19 0932  BP: 128/80  Pulse: (!) 54  Resp: 16  Temp: (!) 96.2 F (35.7 C)   Filed Weights   11/05/19 0932  Weight: (!) 329 lb 3.2 oz (149.3 kg)    Physical Exam Constitutional:      General: He is not in acute distress. HENT:     Head: Normocephalic and atraumatic.  Eyes:     General: No scleral icterus. Cardiovascular:     Rate and Rhythm: Normal rate and  regular rhythm.     Heart sounds: Normal heart sounds.  Pulmonary:     Effort: Pulmonary effort is normal. No respiratory distress.     Breath sounds: No wheezing.  Abdominal:     General: Bowel sounds are normal. There is no distension.     Palpations: Abdomen is soft.  Musculoskeletal:        General: No deformity. Normal range of motion.     Cervical back: Normal range of motion and neck supple.  Skin:    General: Skin is warm and dry.     Findings: No erythema or rash.  Neurological:     Mental Status: He is alert and oriented to person, place, and time. Mental status is at baseline.     Cranial Nerves: No cranial nerve deficit.     Coordination: Coordination normal.  Psychiatric:        Mood and Affect: Mood normal.     LABORATORY DATA:  I have reviewed the data as listed Lab Results  Component Value Date   WBC 10.2 10/06/2019   HGB 15.7 10/06/2019   HCT 45.6 10/06/2019   MCV 84.9  10/06/2019   PLT 268 10/06/2019   Recent Labs    10/05/19 2140 10/06/19 0252  NA 137 138  K 3.8 4.1  CL 103 105  CO2 25 24  GLUCOSE 128* 98  BUN 15 13  CREATININE 0.64 0.51*  CALCIUM 9.3 9.0  GFRNONAA >60 >60  GFRAA >60 >60  PROT 7.6 7.1  ALBUMIN 4.7 4.4  AST 32 24  ALT 41 37  ALKPHOS 76 67  BILITOT 1.3* 1.3*   Iron/TIBC/Ferritin/ %Sat No results found for: IRON, TIBC, FERRITIN, IRONPCTSAT    RADIOGRAPHIC STUDIES: I have personally reviewed the radiological images as listed and agreed with the findings in the report. CT ANGIO HEAD W OR WO CONTRAST  Result Date: 10/06/2019 CLINICAL DATA:  TIA.  Right facial numbness. EXAM: CT ANGIOGRAPHY HEAD AND NECK TECHNIQUE: Multidetector CT imaging of the head and neck was performed using the standard protocol during bolus administration of intravenous contrast. Multiplanar CT image reconstructions and MIPs were obtained to evaluate the vascular anatomy. Carotid stenosis measurements (when applicable) are obtained utilizing NASCET criteria, using the distal internal carotid diameter as the denominator. CONTRAST:  12m OMNIPAQUE IOHEXOL 350 MG/ML SOLN COMPARISON:  CT head and MRI head 10/05/2019 FINDINGS: CT HEAD FINDINGS Brain: No evidence of acute infarction, hemorrhage, hydrocephalus, extra-axial collection or mass lesion/mass effect. Vascular: Negative for hyperdense vessel Skull: No focal skeletal abnormality. Flattening of the right parietal bone. Sinuses: Clear Orbits: Negative Review of the MIP images confirms the above findings CTA NECK FINDINGS Aortic arch: Standard branching. Imaged portion shows no evidence of aneurysm or dissection. No significant stenosis of the major arch vessel origins. Right carotid system: Normal right carotid. No stenosis or dissection Left carotid system: Normal left carotid. No distant oasis or dissection Vertebral arteries: Both vertebral arteries are normal and patent to the basilar. Skeleton: No focal  skeletal lesion. Other neck: No soft tissue mass or adenopathy in the neck. Upper chest: Lung apices clear bilaterally. Review of the MIP images confirms the above findings CTA HEAD FINDINGS Anterior circulation: Cavernous carotid patent bilaterally without stenosis. Anterior and middle cerebral arteries patent bilaterally without stenosis or occlusion. Posterior circulation: Both vertebral arteries patent to the basilar. PICA not visualized bilaterally. AICA patent bilaterally. Basilar widely patent. Superior cerebellar and posterior cerebral arteries patent bilaterally without stenosis. Venous sinuses: Normal venous  enhancement Anatomic variants: None Review of the MIP images confirms the above findings IMPRESSION: 1. Normal CT head 2. Normal CTA head and neck Electronically Signed   By: Franchot Gallo M.D.   On: 10/06/2019 17:28   CT ANGIO NECK W OR WO CONTRAST  Result Date: 10/06/2019 CLINICAL DATA:  TIA.  Right facial numbness. EXAM: CT ANGIOGRAPHY HEAD AND NECK TECHNIQUE: Multidetector CT imaging of the head and neck was performed using the standard protocol during bolus administration of intravenous contrast. Multiplanar CT image reconstructions and MIPs were obtained to evaluate the vascular anatomy. Carotid stenosis measurements (when applicable) are obtained utilizing NASCET criteria, using the distal internal carotid diameter as the denominator. CONTRAST:  54m OMNIPAQUE IOHEXOL 350 MG/ML SOLN COMPARISON:  CT head and MRI head 10/05/2019 FINDINGS: CT HEAD FINDINGS Brain: No evidence of acute infarction, hemorrhage, hydrocephalus, extra-axial collection or mass lesion/mass effect. Vascular: Negative for hyperdense vessel Skull: No focal skeletal abnormality. Flattening of the right parietal bone. Sinuses: Clear Orbits: Negative Review of the MIP images confirms the above findings CTA NECK FINDINGS Aortic arch: Standard branching. Imaged portion shows no evidence of aneurysm or dissection. No  significant stenosis of the major arch vessel origins. Right carotid system: Normal right carotid. No stenosis or dissection Left carotid system: Normal left carotid. No distant oasis or dissection Vertebral arteries: Both vertebral arteries are normal and patent to the basilar. Skeleton: No focal skeletal lesion. Other neck: No soft tissue mass or adenopathy in the neck. Upper chest: Lung apices clear bilaterally. Review of the MIP images confirms the above findings CTA HEAD FINDINGS Anterior circulation: Cavernous carotid patent bilaterally without stenosis. Anterior and middle cerebral arteries patent bilaterally without stenosis or occlusion. Posterior circulation: Both vertebral arteries patent to the basilar. PICA not visualized bilaterally. AICA patent bilaterally. Basilar widely patent. Superior cerebellar and posterior cerebral arteries patent bilaterally without stenosis. Venous sinuses: Normal venous enhancement Anatomic variants: None Review of the MIP images confirms the above findings IMPRESSION: 1. Normal CT head 2. Normal CTA head and neck Electronically Signed   By: CFranchot GalloM.D.   On: 10/06/2019 17:28      ASSESSMENT & PLAN:  1. Abnormal laboratory test   2. TIA (transient ischemic attack)    Differential diagnosis of his recent right side sensation changes include TIA versus anxiety/conversion disorders.  Patient is going to see psychiatrist to manage his anxiety. I reviewed his hypercoagulable work-up. PTT-LA screening testing showed prolongation.  Immediate mixing study showed correction. And incubation mixing study shows prolongation.  This is indicating of inhibitor presence.  Given that DVRRT is negative, less likely he has lupus anticoagulant.  Consider other inhibitors such as factor inhibitors, VIII, IX, XI, XII Check PT, PTT, mixing study.  Check factor VIII antigen and inhibitor  Mildly decreased total protein S antigen level, Recheck protein S free and total  level.   Orders Placed This Encounter  Procedures  . Protein S, total and free    Standing Status:   Future    Standing Expiration Date:   11/04/2020  . Ptt factor inhibitor (mixing study)    Standing Status:   Future    Standing Expiration Date:   11/04/2020  . Factor 8 Inhibitor    Standing Status:   Future    Standing Expiration Date:   11/04/2020   All questions were answered. The patient knows to call the clinic with any problems questions or concerns.  cc TErven Colla DO    Return  of visit: to be determined.  Thank you for this kind referral and the opportunity to participate in the care of this patient. A copy of today's note is routed to referring provider    Earlie Server, MD, PhD Hematology Oncology [facility] Pager- [phone] 11/05/2019

## 2019-11-05 NOTE — Progress Notes (Signed)
New evaluation for abnormal labs. 

## 2019-11-12 ENCOUNTER — Telehealth: Payer: Self-pay | Admitting: Oncology

## 2019-11-12 NOTE — Telephone Encounter (Signed)
Patient phoned on this date and stated that he had a family emergency and needed to reschedule his appt on 11-13-19 to a later date. Writer phoned patient and appt is rescheduled to 11-15-19.

## 2019-11-13 ENCOUNTER — Inpatient Hospital Stay: Payer: BC Managed Care – PPO

## 2019-11-15 ENCOUNTER — Other Ambulatory Visit: Payer: Self-pay

## 2019-11-15 ENCOUNTER — Inpatient Hospital Stay: Payer: BC Managed Care – PPO | Attending: Oncology

## 2019-11-15 DIAGNOSIS — G459 Transient cerebral ischemic attack, unspecified: Secondary | ICD-10-CM

## 2019-11-15 DIAGNOSIS — R791 Abnormal coagulation profile: Secondary | ICD-10-CM | POA: Diagnosis not present

## 2019-11-18 LAB — PROTEIN S, TOTAL AND FREE
Protein S Ag, Free: 81 % (ref 57–157)
Protein S Ag, Total: 70 % (ref 60–150)

## 2019-11-19 LAB — FACTOR 8 INHIBITOR
Coagulation Factor VIII: 88 % (ref 56–140)
aPTT: 29.7 s (ref 22.9–30.2)

## 2019-11-22 ENCOUNTER — Ambulatory Visit (INDEPENDENT_AMBULATORY_CARE_PROVIDER_SITE_OTHER): Payer: BC Managed Care – PPO | Admitting: Nurse Practitioner

## 2019-11-22 ENCOUNTER — Encounter: Payer: Self-pay | Admitting: Nurse Practitioner

## 2019-11-22 ENCOUNTER — Other Ambulatory Visit: Payer: Self-pay

## 2019-11-22 VITALS — BP 132/86 | Temp 98.0°F | Ht 76.5 in | Wt 333.8 lb

## 2019-11-22 DIAGNOSIS — G510 Bell's palsy: Secondary | ICD-10-CM

## 2019-11-22 DIAGNOSIS — H8391 Unspecified disease of right inner ear: Secondary | ICD-10-CM | POA: Diagnosis not present

## 2019-11-22 DIAGNOSIS — R42 Dizziness and giddiness: Secondary | ICD-10-CM

## 2019-11-22 MED ORDER — PREDNISONE 20 MG PO TABS
ORAL_TABLET | ORAL | 0 refills | Status: DC
Start: 2019-11-22 — End: 2019-12-11

## 2019-11-22 NOTE — Progress Notes (Signed)
   Subjective:    Patient ID: Vincent Solis, male    DOB: 29-Aug-1995, 24 y.o.   MRN: [id number]  HPI  Patient arrives to discuss vertigo and return of Bell's Palsey to right side of face. Patient states he also had Bell Palsy in March. Was seen in ED on 3/18 initially for Bell's palsy.  Was seen twice in the office after that for follow-up.  It was felt at that time that the Bell's palsy was most likely related to his recent Covid infection.  Also had a work-up for possible TIA/stroke which was all normal.  Has extreme anxiety at times which may cause physical symptomatology/conversion disorder.  Was started on BuSpar by our office on 6/23 which has helped his anxiety. Has also had some work-up for some abnormal labs. He states his current symptoms are "exactly the same" as his Bell palsy symptoms just not as severe.  Started with a headache that began in the right occipital area and radiated to the front.  This has improved.  Complaints of blurred vision at times mild vertigo.  No syncope.  Began about 3 days ago.  Vertigo mainly with sudden position changes or movement.  No other recent illness.  Review of Systems     Objective:   Physical Exam NAD.  Alert, oriented.  Calm affect.  Making good eye contact.  Dressed appropriately.  Thoughts logical coherent and relevant.  Left TM minimally retracted, clear effusion.  Right TM significant retraction, normal color.  Pharynx clear.  Neck supple with mild soft anterior adenopathy.  Funduscopic exam normal limit.  Pupils equal and reactive to light.  Minimal change in the right eyelid.  Cranial nerves intact, symmetrical movement of the face.  Speech clear.  EOMs intact with minimal nystagmus.  Reflexes upper and lower extremities normal.  Hand strength 5+ bilaterally.  Lungs clear.  Heart regular rate rhythm.  Romberg negative.  Gait normal limit.       Assessment & Plan:  Right-sided Bell's palsy  Vertigo  Inner ear dysfunction, right  Meds  ordered this encounter  Medications  . predniSONE (DELTASONE) 20 MG tablet    Sig: Take 3 (60 mg) po qd x 7 d    Dispense:  21 tablet    Refill:  0    Order Specific Question:   Supervising Provider    Answer:   Babs Sciara [id number]   Prescription for prednisone sent in to have on hold for the weekend in case symptoms progress. Avoid sudden movements.  Expect gradual resolution of symptoms.  If Bell's palsy progresses or if symptoms persist, recommend referral to neurology.  Recheck here as needed.

## 2019-11-25 ENCOUNTER — Telehealth: Payer: Self-pay

## 2019-11-25 ENCOUNTER — Encounter: Payer: Self-pay | Admitting: Nurse Practitioner

## 2019-11-25 NOTE — Telephone Encounter (Signed)
-----   Message from Rickard Patience, MD sent at 11/23/2019  4:58 PM EDT ----- Please let patient know that repeat testing showed that previously abnormal testing will normalized. He does not need to follow-up.  Thank you

## 2019-11-25 NOTE — Telephone Encounter (Signed)
Patient notified

## 2019-11-28 ENCOUNTER — Telehealth: Payer: Self-pay | Admitting: Family Medicine

## 2019-11-28 NOTE — Telephone Encounter (Signed)
Discussed with pt. Pt verbalized understanding.  °

## 2019-11-28 NOTE — Telephone Encounter (Signed)
Sounds like normal side effect of prednisone.  Make sure he stopped it. If having fluid in the ear, take flonase otc daily for 7 days and sudafed for 7 days, if still having fluid issues, then come back for exam.   Dr. Ladona Ridgel

## 2019-11-28 NOTE — Telephone Encounter (Signed)
Patient started prednisone Sunday and noticed symptoms the night before last and stopped medication. He is experiencing splotchy rash, night sweats and trouble sleeping. His ear started bothering him yesterday- says it feels like he has water in his ear and sounds are muffled.

## 2019-11-28 NOTE — Telephone Encounter (Signed)
Patient is requesting something else to be called in because prednisone is causing night sweats and a rash over his body.He was seen 7/16. [facility] off [address]

## 2019-11-29 ENCOUNTER — Other Ambulatory Visit: Payer: Self-pay | Admitting: Family Medicine

## 2019-11-29 DIAGNOSIS — F419 Anxiety disorder, unspecified: Secondary | ICD-10-CM

## 2019-11-29 DIAGNOSIS — R200 Anesthesia of skin: Secondary | ICD-10-CM

## 2019-12-11 ENCOUNTER — Other Ambulatory Visit: Payer: Self-pay

## 2019-12-11 ENCOUNTER — Ambulatory Visit (INDEPENDENT_AMBULATORY_CARE_PROVIDER_SITE_OTHER): Payer: BC Managed Care – PPO | Admitting: Family Medicine

## 2019-12-11 ENCOUNTER — Encounter: Payer: Self-pay | Admitting: Family Medicine

## 2019-12-11 VITALS — BP 118/76 | Temp 78.0°F | Ht 76.5 in | Wt 331.0 lb

## 2019-12-11 DIAGNOSIS — R1084 Generalized abdominal pain: Secondary | ICD-10-CM

## 2019-12-11 DIAGNOSIS — I10 Essential (primary) hypertension: Secondary | ICD-10-CM

## 2019-12-11 MED ORDER — PANTOPRAZOLE SODIUM 40 MG PO TBEC
40.0000 mg | DELAYED_RELEASE_TABLET | Freq: Every day | ORAL | 1 refills | Status: DC
Start: 2019-12-11 — End: 2020-02-06

## 2019-12-11 MED ORDER — SUCRALFATE 1 G PO TABS
ORAL_TABLET | ORAL | 0 refills | Status: DC
Start: 2019-12-11 — End: 2020-03-03

## 2019-12-11 NOTE — Progress Notes (Signed)
   Subjective:    Patient ID: Vincent Solis, male    DOB: 11-17-1995, 24 y.o.   MRN: [id number]  Abdominal Pain This is a new problem. Episode onset: one week. The pain is located in the epigastric region (soreness in back). The quality of the pain is cramping. The pain is aggravated by eating. He has tried nothing for the symptoms.   3 weeks ago noticed more swelling Lap band surg 4 years ago Last 7 to 10 days ache and cramp after eating Does radiate into the back No n no v Some vertigo with recent ear infection abd sx throughout the whole tried no meds Was on prednisoneday Hurts with certain eating    Review of Systems  Gastrointestinal: Positive for abdominal pain.   Denies fever vomiting diarrhea black stools    Objective:   Physical Exam Lungs clear heart regular abdomen moderate obesity some mild subjective discomfort in epigastric region       Assessment & Plan:  1. Generalized abdominal pain Lab work ordered Pantoprazole as well as Carafate to try to heal up the lining of the gastric lining felt that this is possibly related to recent prednisone If it does not dramatically improve in the course the next 2 to 3 weeks gastroenterology consult Patient to give [place] update within the next 2 weeks No sign of surgical abdomen - CBC with Differential/Platelet - Basic metabolic panel - Hepatic function panel - Lipase  2. Essential hypertension, benign Blood pressure good control continue current measures - CBC with Differential/Platelet - Basic metabolic panel - Hepatic function panel - Lipase

## 2019-12-11 NOTE — Progress Notes (Signed)
° °  Subjective:    Patient ID: Vincent Solis, male    DOB: 1995-06-07, 24 y.o.   MRN: [id number]  HPI    Review of Systems     Objective:   Physical Exam        Assessment & Plan:

## 2019-12-12 DIAGNOSIS — R1084 Generalized abdominal pain: Secondary | ICD-10-CM | POA: Diagnosis not present

## 2019-12-12 DIAGNOSIS — I10 Essential (primary) hypertension: Secondary | ICD-10-CM | POA: Diagnosis not present

## 2019-12-13 ENCOUNTER — Other Ambulatory Visit: Payer: Self-pay | Admitting: Family Medicine

## 2019-12-13 DIAGNOSIS — R748 Abnormal levels of other serum enzymes: Secondary | ICD-10-CM

## 2019-12-13 DIAGNOSIS — R1084 Generalized abdominal pain: Secondary | ICD-10-CM

## 2019-12-13 LAB — CBC WITH DIFFERENTIAL/PLATELET
Basophils Absolute: 0 10*3/uL (ref 0.0–0.2)
Basos: 0 %
EOS (ABSOLUTE): 0.2 10*3/uL (ref 0.0–0.4)
Eos: 3 %
Hematocrit: 51.6 % — ABNORMAL HIGH (ref 37.5–51.0)
Hemoglobin: 17.4 g/dL (ref 13.0–17.7)
Immature Grans (Abs): 0 10*3/uL (ref 0.0–0.1)
Immature Granulocytes: 0 %
Lymphocytes Absolute: 2.1 10*3/uL (ref 0.7–3.1)
Lymphs: 30 %
MCH: 29.2 pg (ref 26.6–33.0)
MCHC: 33.7 g/dL (ref 31.5–35.7)
MCV: 87 fL (ref 79–97)
Monocytes Absolute: 0.6 10*3/uL (ref 0.1–0.9)
Monocytes: 8 %
Neutrophils Absolute: 4.2 10*3/uL (ref 1.4–7.0)
Neutrophils: 59 %
Platelets: 264 10*3/uL (ref 150–450)
RBC: 5.96 x10E6/uL — ABNORMAL HIGH (ref 4.14–5.80)
RDW: 12.1 % (ref 11.6–15.4)
WBC: 7.2 10*3/uL (ref 3.4–10.8)

## 2019-12-13 LAB — HEPATIC FUNCTION PANEL
ALT: 45 IU/L — ABNORMAL HIGH (ref 0–44)
AST: 27 IU/L (ref 0–40)
Albumin: 4.9 g/dL (ref 4.1–5.2)
Alkaline Phosphatase: 83 IU/L (ref 48–121)
Bilirubin Total: 1.7 mg/dL — ABNORMAL HIGH (ref 0.0–1.2)
Bilirubin, Direct: 0.34 mg/dL (ref 0.00–0.40)
Total Protein: 7.4 g/dL (ref 6.0–8.5)

## 2019-12-13 LAB — BASIC METABOLIC PANEL
BUN/Creatinine Ratio: 14 (ref 9–20)
BUN: 11 mg/dL (ref 6–20)
CO2: 26 mmol/L (ref 20–29)
Calcium: 9.7 mg/dL (ref 8.7–10.2)
Chloride: 103 mmol/L (ref 96–106)
Creatinine, Ser: 0.77 mg/dL (ref 0.76–1.27)
GFR calc Af Amer: 147 mL/min/{1.73_m2} (ref >59)
GFR calc non Af Amer: 127 mL/min/{1.73_m2} (ref >59)
Glucose: 80 mg/dL (ref 65–99)
Potassium: 4.9 mmol/L (ref 3.5–5.2)
Sodium: 142 mmol/L (ref 134–144)

## 2019-12-13 LAB — LIPASE: Lipase: 24 U/L (ref 13–78)

## 2019-12-17 ENCOUNTER — Other Ambulatory Visit: Payer: Self-pay | Admitting: *Deleted

## 2019-12-17 DIAGNOSIS — R17 Unspecified jaundice: Secondary | ICD-10-CM

## 2019-12-17 DIAGNOSIS — R748 Abnormal levels of other serum enzymes: Secondary | ICD-10-CM

## 2019-12-23 ENCOUNTER — Ambulatory Visit (HOSPITAL_COMMUNITY)
Admission: RE | Admit: 2019-12-23 | Discharge: 2019-12-23 | Disposition: A | Discharge status: Home / Self Care | Payer: BC Managed Care – PPO | Source: Ambulatory Visit | Attending: Family Medicine | Admitting: Family Medicine

## 2019-12-23 ENCOUNTER — Other Ambulatory Visit: Payer: Self-pay | Admitting: *Deleted

## 2019-12-23 ENCOUNTER — Other Ambulatory Visit: Payer: Self-pay

## 2019-12-23 ENCOUNTER — Ambulatory Visit (HOSPITAL_COMMUNITY): Payer: BC Managed Care – PPO

## 2019-12-23 DIAGNOSIS — R748 Abnormal levels of other serum enzymes: Secondary | ICD-10-CM | POA: Insufficient documentation

## 2019-12-23 DIAGNOSIS — R17 Unspecified jaundice: Secondary | ICD-10-CM | POA: Diagnosis not present

## 2019-12-23 IMAGING — US US ABDOMEN COMPLETE
1 series · 14 of 25 positions shown · non-contrast
Comparison: None.

CLINICAL DATA: Elevated liver enzymes

EXAM:
ABDOMEN ULTRASOUND COMPLETE

[Series 1: us abdomen complete · 14 of 105 slices shown]
[im 1/105]
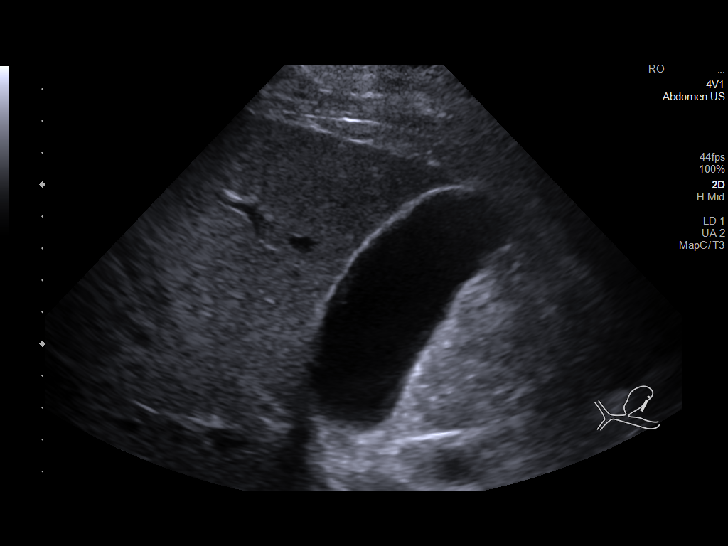
[im 9/105]
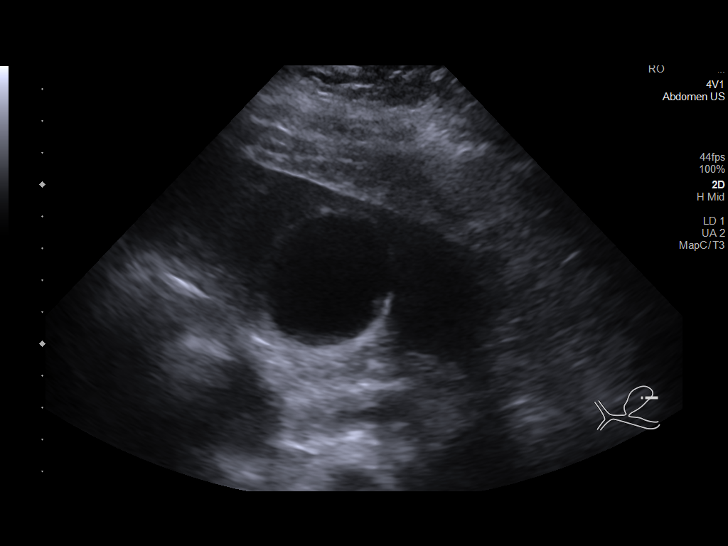
[im 18/105]
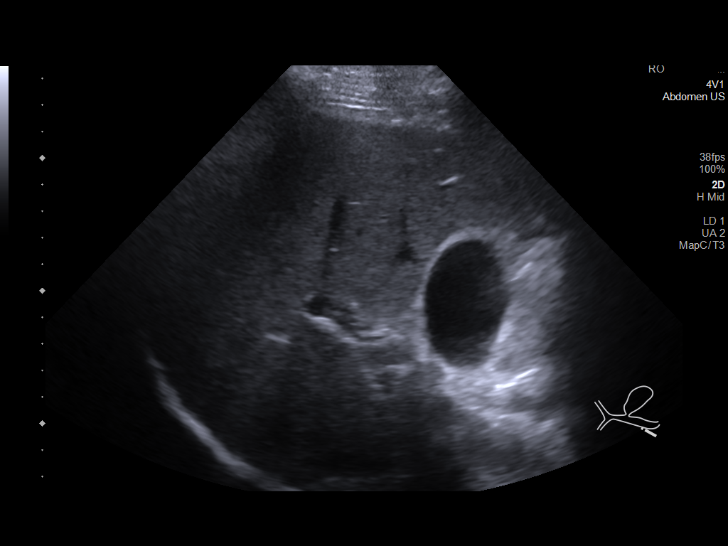
[im 27/105]
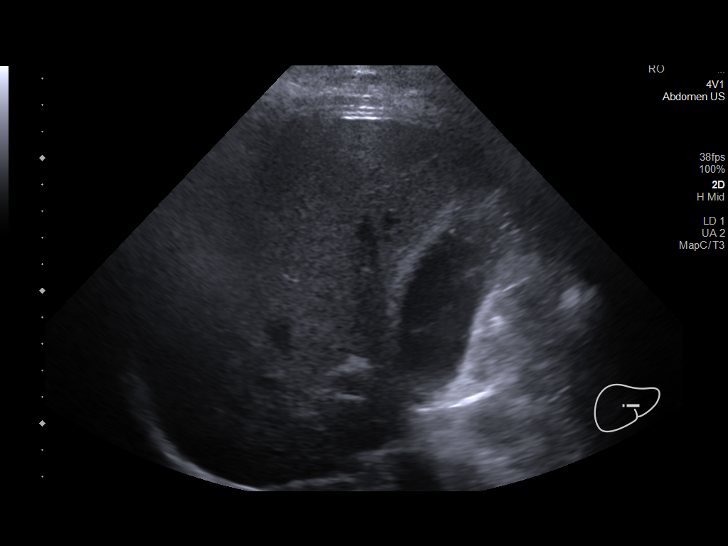
[im 35/105]
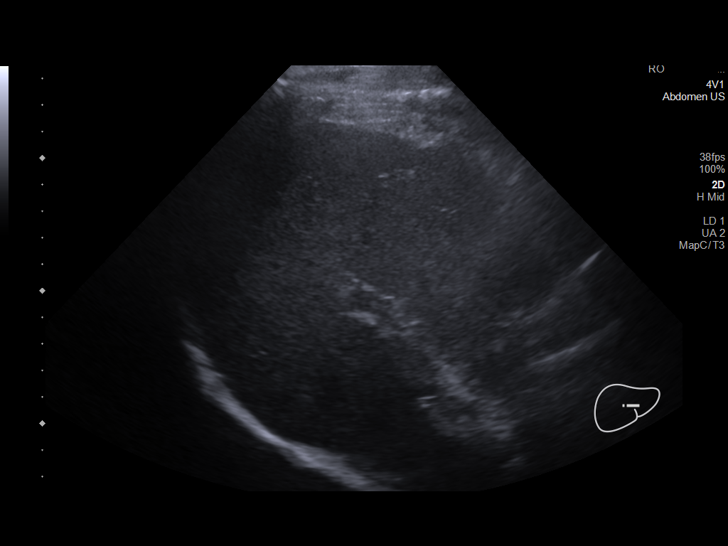
[im 40/105]
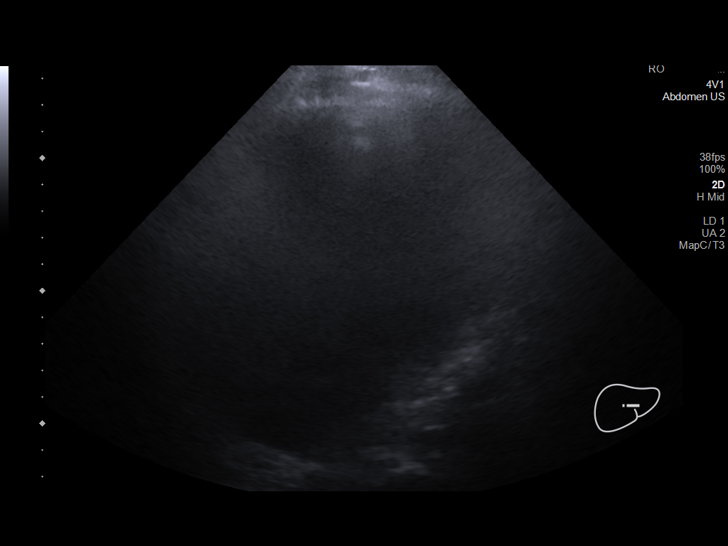
[im 48/105]
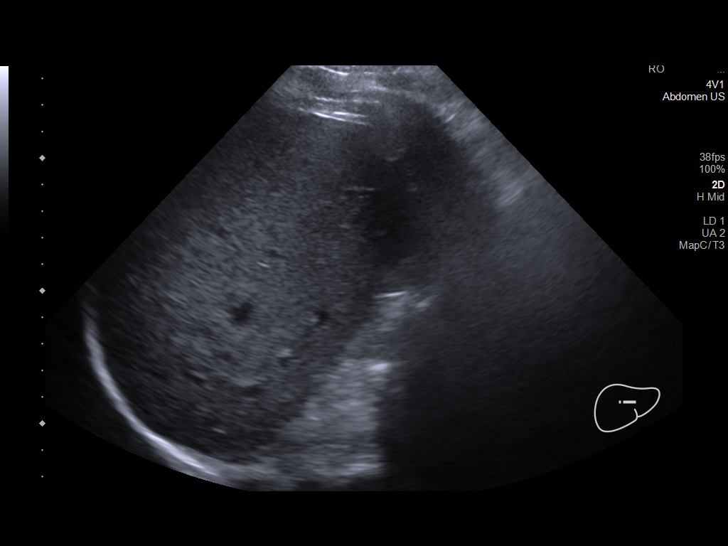
[im 57/105]
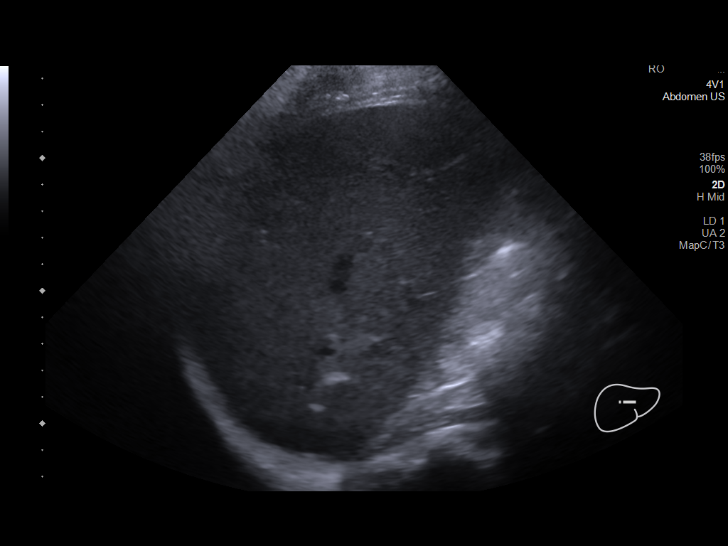
[im 66/105]
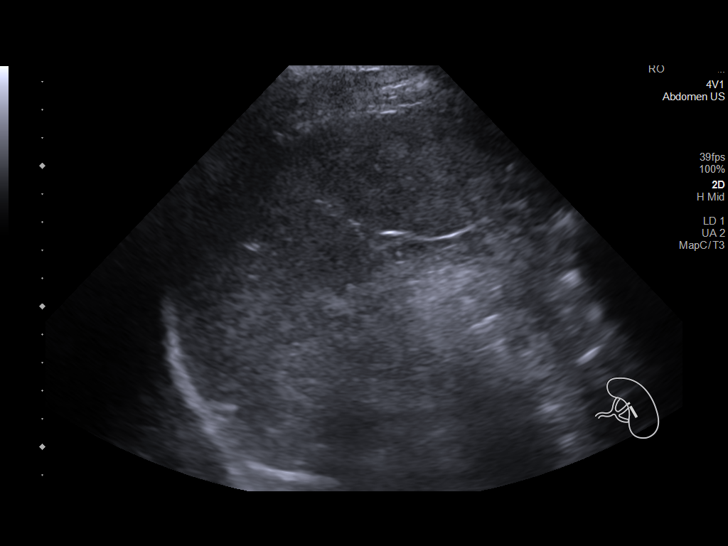
[im 70/105]
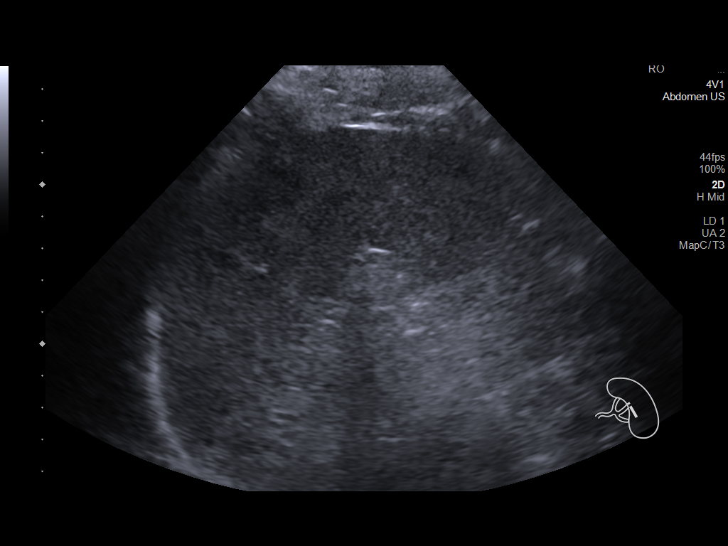
[im 79/105]
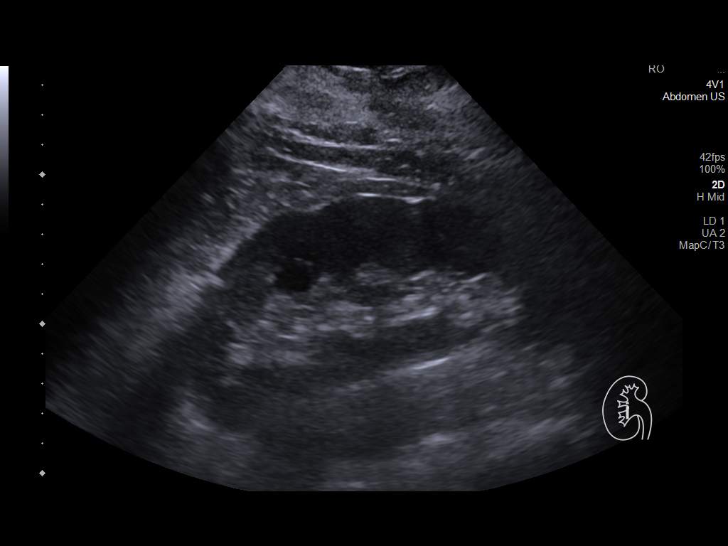
[im 87/105]
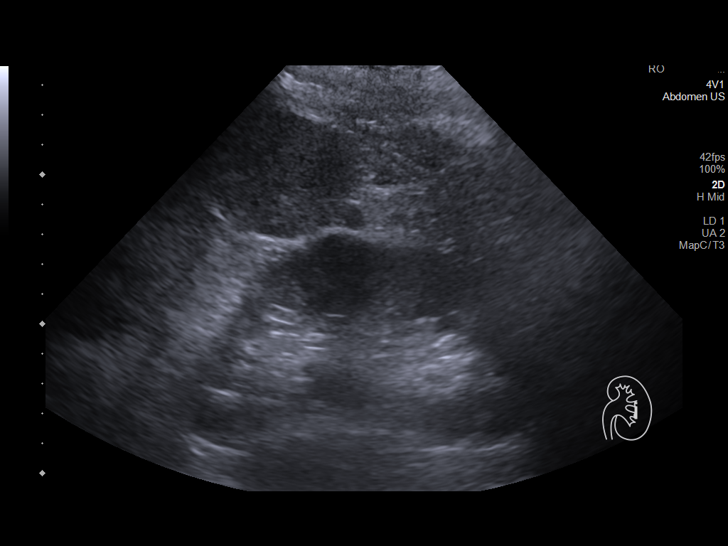
[im 96/105]
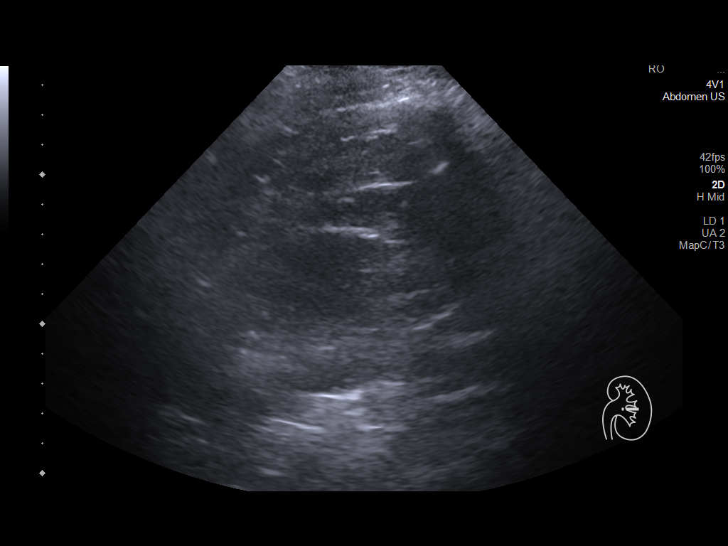
[im 105/105]
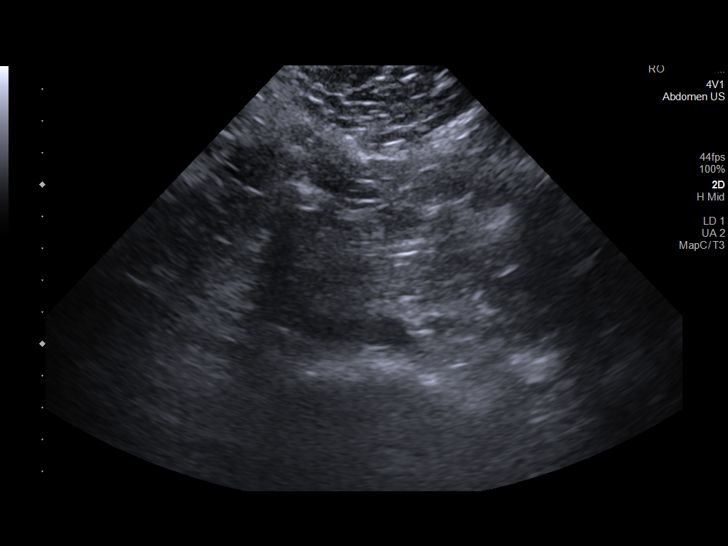

[14 of 25 positions shown; findings below may reference images not displayed]

FINDINGS: Gallbladder: No gallstones or wall thickening visualized. There is
no pericholecystic fluid. No sonographic Murphy sign noted by
sonographer.

Common bile duct: Diameter: 5 mm. No intrahepatic, common hepatic,
or common bile duct dilatation.

Liver: No focal lesion identified. Within normal limits in
parenchymal echogenicity. Portal vein is patent on color Doppler
imaging with normal direction of blood flow towards the liver.

IVC: No abnormality visualized.

Pancreas: There is no pancreatic mass or inflammatory focus.

Spleen: Size and appearance within normal limits.

Right Kidney: Length: 13.6 cm. Echogenicity within normal limits. No
mass or hydronephrosis visualized.

Left Kidney: Length: 12.0 cm. Echogenicity within normal limits. No
mass or hydronephrosis visualized.

Abdominal aorta: No aneurysm visualized.

Other findings: No appreciable ascites.
IMPRESSION: Study within normal limits.

## 2020-01-22 ENCOUNTER — Ambulatory Visit (INDEPENDENT_AMBULATORY_CARE_PROVIDER_SITE_OTHER): Payer: BC Managed Care – PPO | Admitting: Family Medicine

## 2020-01-22 ENCOUNTER — Encounter: Payer: Self-pay | Admitting: Family Medicine

## 2020-01-22 ENCOUNTER — Encounter: Payer: Self-pay | Admitting: Neurology

## 2020-01-22 ENCOUNTER — Other Ambulatory Visit: Payer: Self-pay

## 2020-01-22 VITALS — BP 122/82 | HR 82 | Temp 97.3°F | Ht 76.5 in | Wt 334.0 lb

## 2020-01-22 DIAGNOSIS — K219 Gastro-esophageal reflux disease without esophagitis: Secondary | ICD-10-CM | POA: Diagnosis not present

## 2020-01-22 DIAGNOSIS — G43109 Migraine with aura, not intractable, without status migrainosus: Secondary | ICD-10-CM | POA: Diagnosis not present

## 2020-01-22 MED ORDER — RIZATRIPTAN BENZOATE 5 MG PO TABS: 5.0000 mg | ORAL_TABLET | ORAL | 0 refills | Status: DC | PRN

## 2020-01-22 NOTE — Progress Notes (Signed)
Patient ID: Vincent Solis, male    DOB: 10-23-1995, 24 y.o.   MRN: [id number]   Chief Complaint  Patient presents with  . follow up on stomach issues and vertigo    doing better with stomach issues but still having issues with vertigo like symptoms   Subjective:    HPI Abdominal pain- started the protonix and carafate from last visit in 12/11/19. Improved since on the medications.    A few months ago thought inner ear infection and on rt side. Since June feeling this discomfort.  Treated with abx and not really resolved.  Last couple months noticing headache on rt sided back of head.  Uncoordinated/unbalanced.  No vision changes. Numbness in fingers on rt and toes.  When walking not fully aware of surroundings, but not feeling like he's going to fall down or grabbing onto things.  Not feeling like wanting to work out or golf like used to. Never had migraines before. No help with ibuprofen.  Almost every day having this headache/off balance sensation.  Morning okay and then by lunch time then get a little blurry vision, then the headache starts on back of head on right posterior area. Comes mid day.  Has some anxiety and stress at work. No sound sensitivity.  Light sensitivity.  Some nausea with it.  When had bad anxiety attack had right sided symptoms and bell's palsy in spring on rt in the spring.  Had trial off the buspar and didn't take the meds for a few days, to see if headaches improved.  Still having headaches, so doesn't think it's related to buspar, helping with anxiety so pt restarted it.  Had head imaging in last few months, which was normal. (angio neck/head, and MRI brain and CT head)- all normal.   Medical History Vincent Solis has a past medical history of Hypertension, Low testosterone, Obesity, PONV (postoperative nausea and vomiting), and Seasonal allergies.   Outpatient Encounter Medications as of 01/22/2020  Medication Sig  . albuterol (VENTOLIN HFA) 108 (90 Base)  MCG/ACT inhaler TAKE 2 PUFFS BY MOUTH 4 TIMES A DAY AS NEEDED FOR WHEEZE  . busPIRone (BUSPAR) 5 MG tablet TAKE 1 TABLET BY MOUTH TWICE A DAY  . enalapril (VASOTEC) 10 MG tablet Take 1 tablet (10 mg total) by mouth daily.  . Multiple Vitamin (MULTIVITAMIN WITH MINERALS) TABS tablet Take 1 tablet by mouth daily. Reported on 07/27/2015  . pantoprazole (PROTONIX) 40 MG tablet Take 1 tablet (40 mg total) by mouth daily.  . rizatriptan (MAXALT) 5 MG tablet Take 1 tablet (5 mg total) by mouth as needed for migraine. May repeat in 2 hours if needed  . sucralfate (CARAFATE) 1 g tablet Use TID with meals   No facility-administered encounter medications on file as of 01/22/2020.     Review of Systems  Constitutional: Negative for chills and fever.  HENT: Negative for congestion, rhinorrhea and sore throat.   Eyes: Positive for visual disturbance.  Respiratory: Negative for cough, shortness of breath and wheezing.   Cardiovascular: Negative for chest pain and leg swelling.  Gastrointestinal: Negative for abdominal pain, diarrhea, nausea and vomiting.  Genitourinary: Negative for dysuria and frequency.  Skin: Negative for rash.  Neurological: Positive for numbness (occ in fingers and toes on right) and headaches. Negative for dizziness, tremors, seizures, speech difficulty, weakness and light-headedness.  Psychiatric/Behavioral: Negative for decreased concentration, dysphoric mood, hallucinations, self-injury, sleep disturbance and suicidal ideas. The patient is nervous/anxious.      Vitals BP 122/82  Pulse 82   Temp (!) 97.3 F (36.3 C) (Oral)   Ht 6' 4.5" (1.943 m)   Wt (!) 334 lb (151.5 kg)   SpO2 96%   BMI 40.13 kg/m   Objective:   Physical Exam Vitals and nursing note reviewed.  Constitutional:      General: He is not in acute distress.    Appearance: Normal appearance. He is not ill-appearing.  HENT:     Head: Normocephalic.     Right Ear: Tympanic membrane, ear canal and  external ear normal.     Left Ear: Tympanic membrane, ear canal and external ear normal.     Nose: Nose normal. No congestion.     Mouth/Throat:     Mouth: Mucous membranes are moist.     Pharynx: No oropharyngeal exudate.  Eyes:     Extraocular Movements: Extraocular movements intact.     Conjunctiva/sclera: Conjunctivae normal.     Pupils: Pupils are equal, round, and reactive to light.  Cardiovascular:     Rate and Rhythm: Normal rate and regular rhythm.     Pulses: Normal pulses.     Heart sounds: Normal heart sounds. No murmur heard.   Pulmonary:     Effort: Pulmonary effort is normal.     Breath sounds: Normal breath sounds. No wheezing, rhonchi or rales.  Musculoskeletal:        General: Normal range of motion.     Right lower leg: No edema.     Left lower leg: No edema.  Skin:    General: Skin is warm and dry.     Findings: No rash.  Neurological:     General: No focal deficit present.     Mental Status: He is alert and oriented to person, place, and time.     Cranial Nerves: No cranial nerve deficit.     Sensory: No sensory deficit.     Motor: No weakness.     Coordination: Coordination normal.     Gait: Gait normal.  Psychiatric:        Mood and Affect: Mood normal.        Behavior: Behavior normal.        Thought Content: Thought content normal.        Judgment: Judgment normal.      Assessment and Plan   1. Migraine with aura and without status migrainosus, not intractable - Ambulatory referral to Neurology - rizatriptan (MAXALT) 5 MG tablet; Take 1 tablet (5 mg total) by mouth as needed for migraine. May repeat in 2 hours if needed  Dispense: 10 tablet; Refill: 0  2. Gastroesophageal reflux disease, unspecified whether esophagitis present   Will give trial of maxalt and take 600-800mg  ibuprofen at onset of headache. Increase fluids.  Call in next 1-2 wks to let us know how it's going.  Also gave referral to neuro for the concerns of tia, bell's palsy,  and now these migraines all on right side; if our trial of migraine medication isn't helping.  gerd and abd pain -improved with medications.  Cont meds.  F/u 72mo for recheck.

## 2020-01-22 NOTE — Patient Instructions (Signed)

## 2020-02-06 ENCOUNTER — Other Ambulatory Visit: Payer: Self-pay | Admitting: Family Medicine

## 2020-02-11 ENCOUNTER — Other Ambulatory Visit: Payer: Self-pay

## 2020-02-11 DIAGNOSIS — G43109 Migraine with aura, not intractable, without status migrainosus: Secondary | ICD-10-CM

## 2020-02-11 MED ORDER — RIZATRIPTAN BENZOATE 5 MG PO TABS: 5.0000 mg | ORAL_TABLET | ORAL | 0 refills | Status: DC | PRN

## 2020-02-12 ENCOUNTER — Emergency Department (HOSPITAL_COMMUNITY)
Admission: EM | Admit: 2020-02-12 | Discharge: 2020-02-13 | Disposition: A | Discharge status: Home / Self Care | Payer: BC Managed Care – PPO | Attending: Emergency Medicine | Admitting: Emergency Medicine

## 2020-02-12 ENCOUNTER — Other Ambulatory Visit: Payer: Self-pay

## 2020-02-12 ENCOUNTER — Encounter (HOSPITAL_COMMUNITY): Payer: Self-pay

## 2020-02-12 ENCOUNTER — Emergency Department (HOSPITAL_COMMUNITY): Payer: BC Managed Care – PPO

## 2020-02-12 DIAGNOSIS — R200 Anesthesia of skin: Secondary | ICD-10-CM | POA: Diagnosis not present

## 2020-02-12 DIAGNOSIS — R079 Chest pain, unspecified: Secondary | ICD-10-CM | POA: Diagnosis not present

## 2020-02-12 DIAGNOSIS — Z5321 Procedure and treatment not carried out due to patient leaving prior to being seen by health care provider: Secondary | ICD-10-CM | POA: Diagnosis not present

## 2020-02-12 DIAGNOSIS — R519 Headache, unspecified: Secondary | ICD-10-CM | POA: Diagnosis not present

## 2020-02-12 LAB — BASIC METABOLIC PANEL
Anion gap: 12 (ref 5–15)
BUN: 9 mg/dL (ref 6–20)
CO2: 24 mmol/L (ref 22–32)
Calcium: 9.5 mg/dL (ref 8.9–10.3)
Chloride: 102 mmol/L (ref 98–111)
Creatinine, Ser: 0.77 mg/dL (ref 0.61–1.24)
GFR calc non Af Amer: >60 mL/min (ref >60)
Glucose, Bld: 93 mg/dL (ref 70–99)
Potassium: 3.7 mmol/L (ref 3.5–5.1)
Sodium: 138 mmol/L (ref 135–145)

## 2020-02-12 LAB — CBC
HCT: 47.9 % (ref 39.0–52.0)
Hemoglobin: 16.2 g/dL (ref 13.0–17.0)
MCH: 28.3 pg (ref 26.0–34.0)
MCHC: 33.8 g/dL (ref 30.0–36.0)
MCV: 83.7 fL (ref 80.0–100.0)
Platelets: 289 10*3/uL (ref 150–400)
RBC: 5.72 MIL/uL (ref 4.22–5.81)
RDW: 11.9 % (ref 11.5–15.5)
WBC: 9 10*3/uL (ref 4.0–10.5)
nRBC: 0 % (ref 0.0–0.2)

## 2020-02-12 LAB — TROPONIN I (HIGH SENSITIVITY)
Troponin I (High Sensitivity): 9 ng/L (ref <18)
Troponin I (High Sensitivity): 9 ng/L (ref <18)

## 2020-02-12 IMAGING — DX DG CHEST 2V
2 series · 2 of 2 positions shown · non-contrast
Comparison: 06/25/2015

CLINICAL DATA: Left-sided chest pain for 1 day.

EXAM:
CHEST - 2 VIEW

[chest pa]
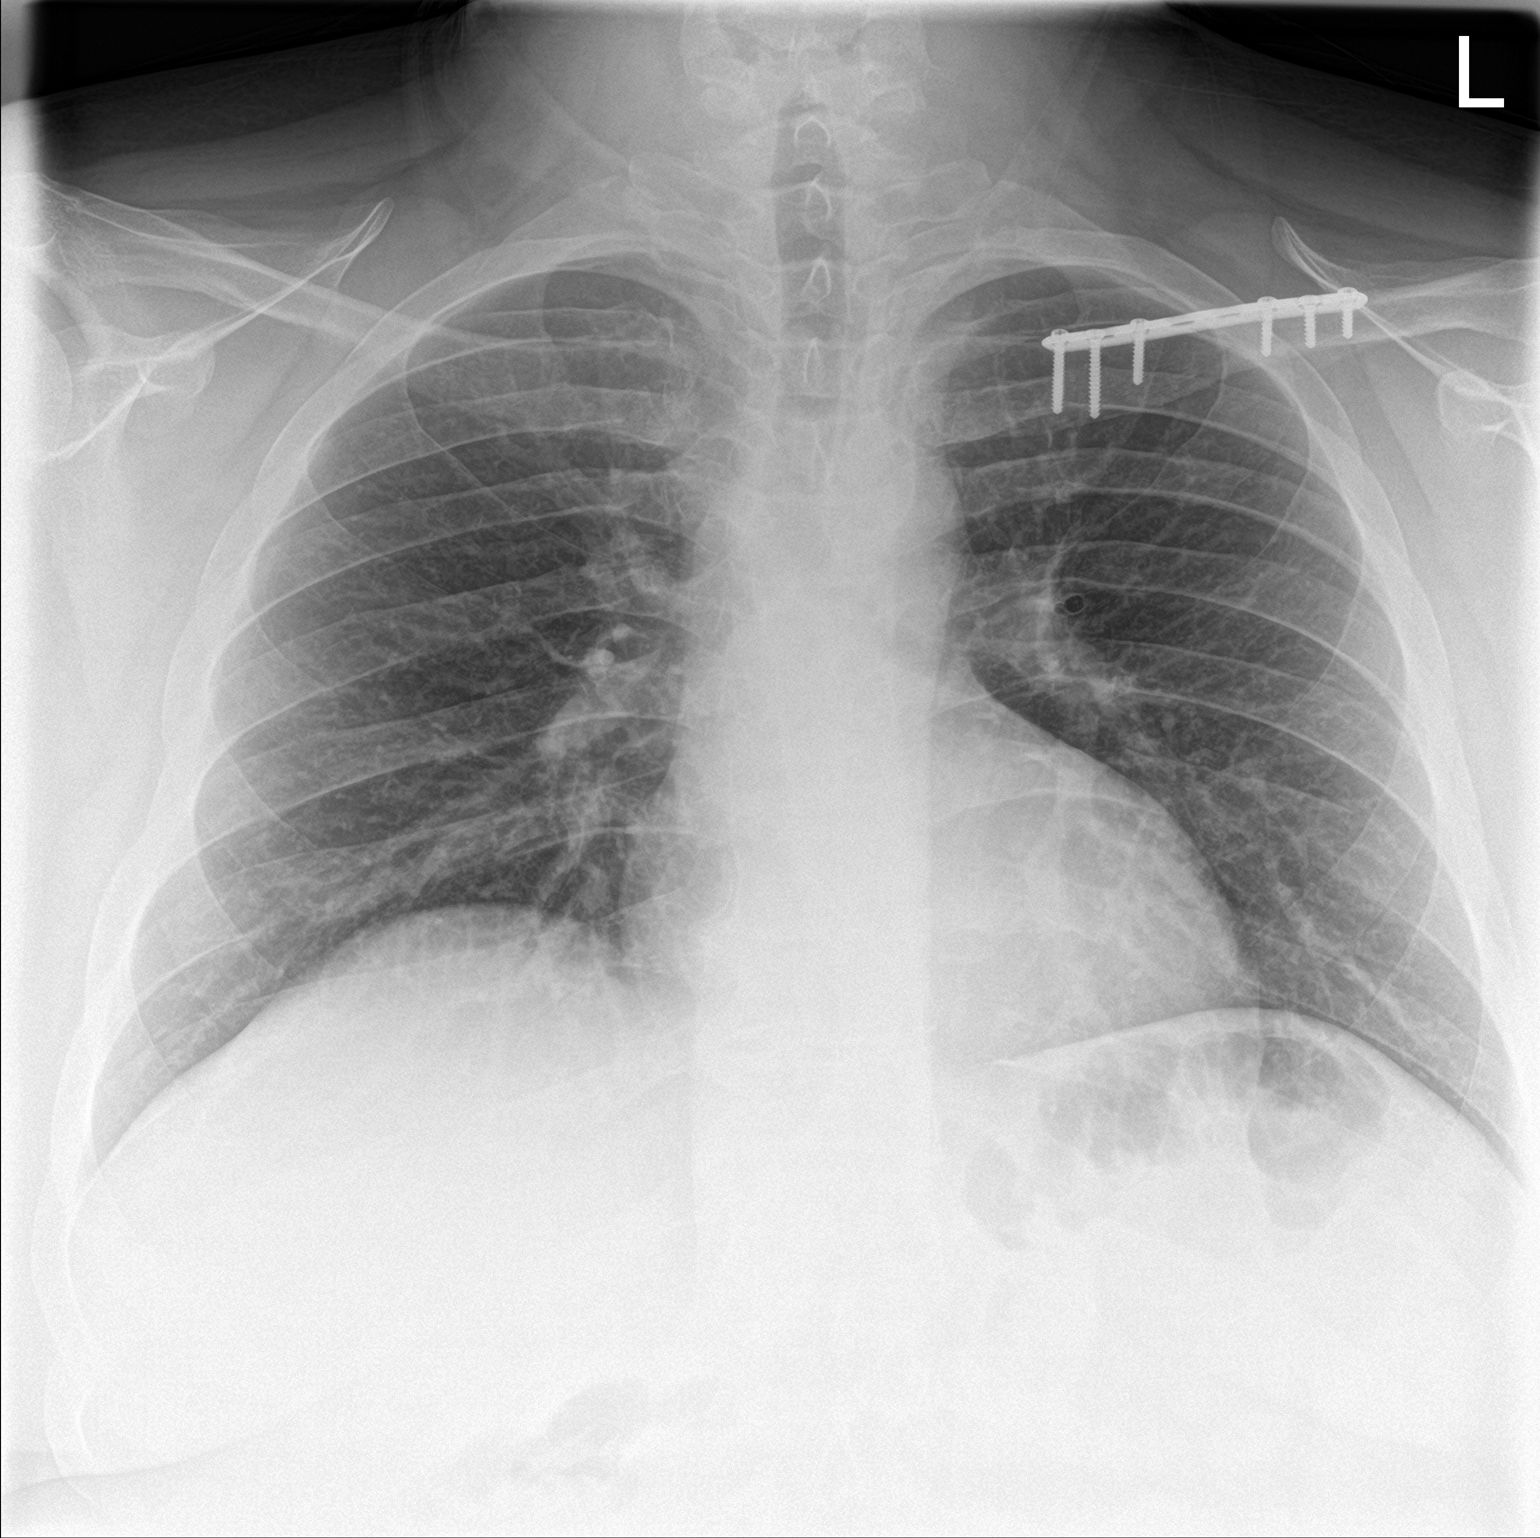

[chest lat]
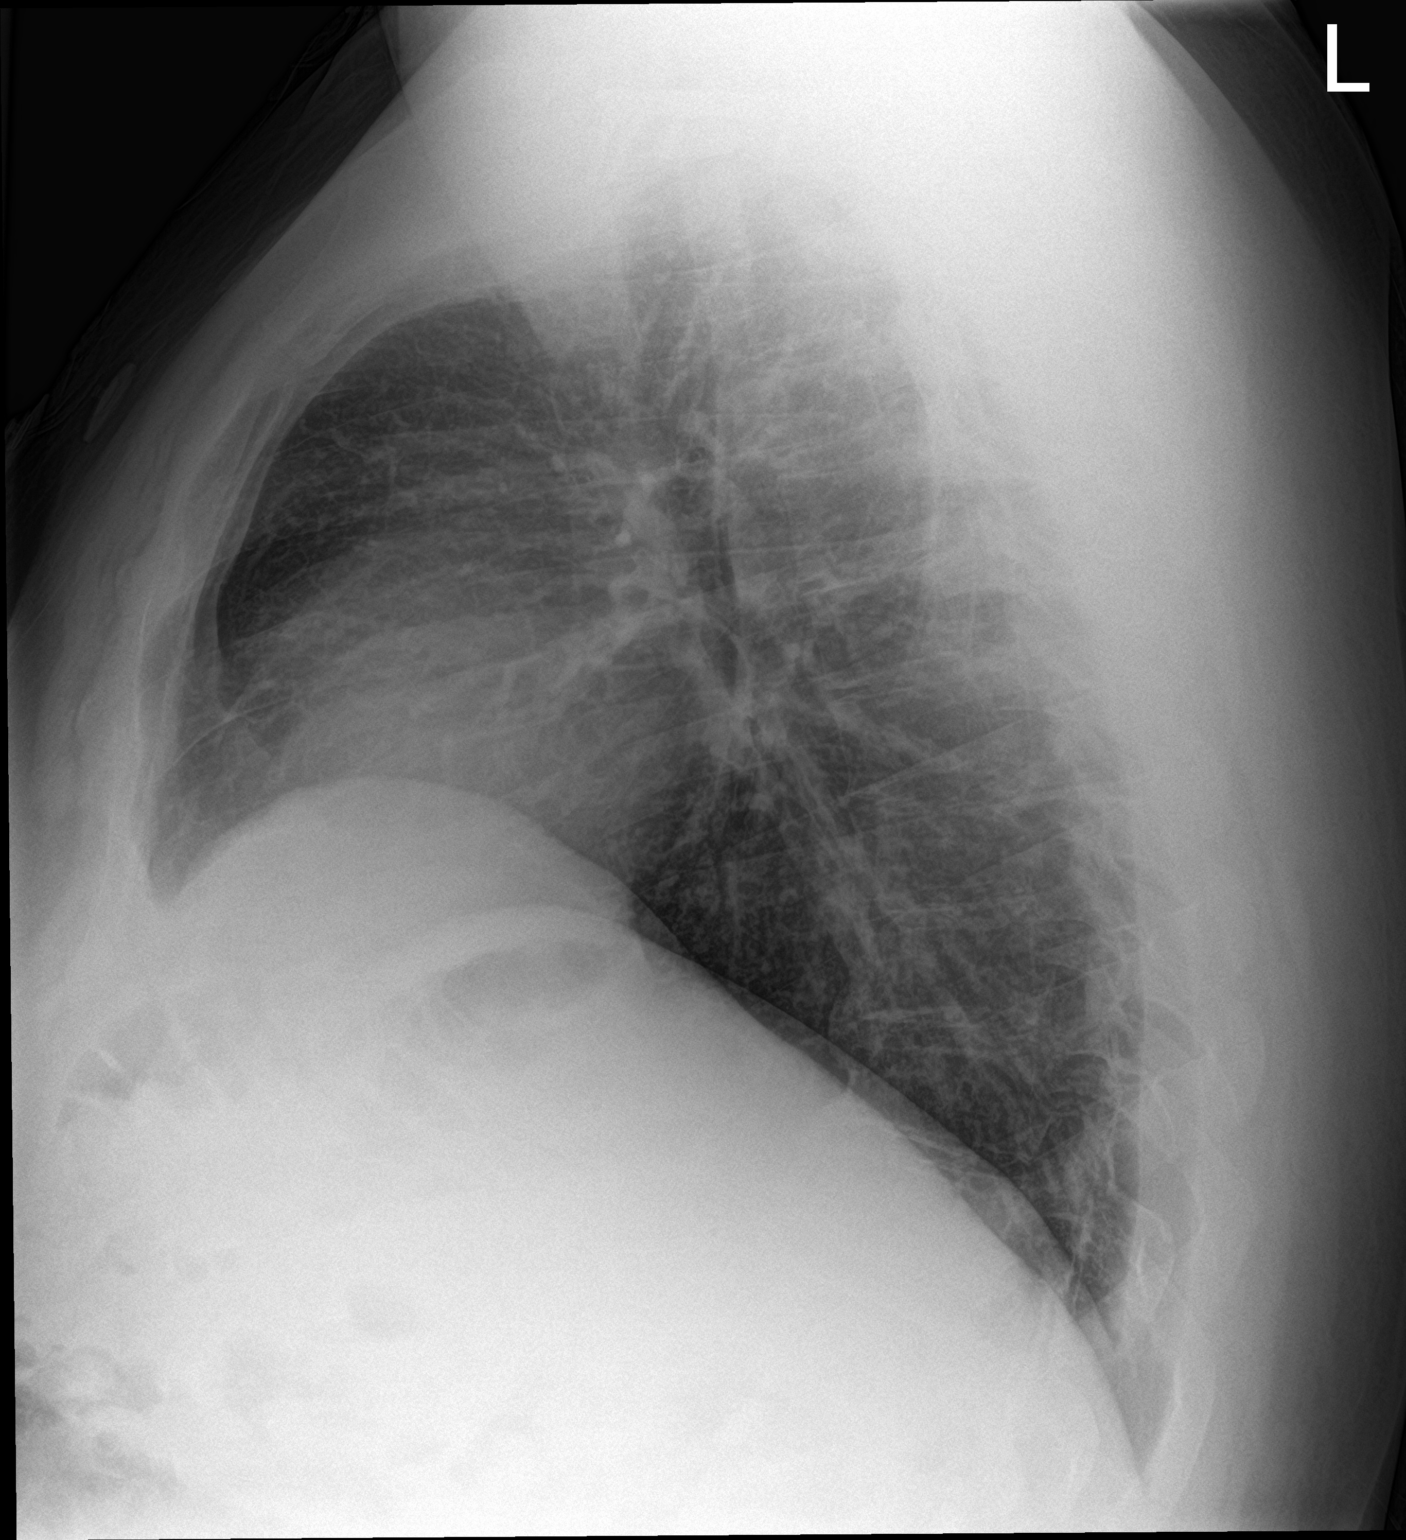

[2 of 2 positions shown; findings below may reference images not displayed]

FINDINGS: The cardiomediastinal contours are normal. The lungs are clear.
Pulmonary vasculature is normal. No consolidation, pleural effusion,
or pneumothorax. Surgical fixation of the left clavicle. No acute
osseous abnormalities are seen. Degenerative change in the thoracic
spine, advanced for age.
IMPRESSION: No acute chest findings.

## 2020-02-12 NOTE — ED Triage Notes (Signed)
Pt reports left sided chest pain and right sided posterior headache for the past 2 weeks. Pt also reports intermittent right arm numbness since the headaches started, pt saw his PCP and gave him a referral for neurology but he has an appointment for next week. No neuro deficits noted in triage. Denies any SOB

## 2020-02-13 NOTE — ED Notes (Signed)
Unable to locate pt for room. 

## 2020-02-17 ENCOUNTER — Ambulatory Visit: Payer: BC Managed Care – PPO | Admitting: Family Medicine

## 2020-02-19 DIAGNOSIS — Z20822 Contact with and (suspected) exposure to covid-19: Secondary | ICD-10-CM | POA: Diagnosis not present

## 2020-03-03 ENCOUNTER — Encounter: Payer: Self-pay | Admitting: Cardiology

## 2020-03-03 ENCOUNTER — Ambulatory Visit (INDEPENDENT_AMBULATORY_CARE_PROVIDER_SITE_OTHER): Payer: BC Managed Care – PPO | Admitting: Cardiology

## 2020-03-03 ENCOUNTER — Other Ambulatory Visit: Payer: Self-pay

## 2020-03-03 VITALS — BP 142/96 | HR 63 | Ht 77.0 in | Wt 333.0 lb

## 2020-03-03 DIAGNOSIS — R079 Chest pain, unspecified: Secondary | ICD-10-CM

## 2020-03-03 DIAGNOSIS — I10 Essential (primary) hypertension: Secondary | ICD-10-CM
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
## 2020-03-03 NOTE — Patient Instructions (Signed)

## 2020-03-05 ENCOUNTER — Other Ambulatory Visit: Payer: Self-pay | Admitting: Family Medicine

## 2020-03-06 ENCOUNTER — Other Ambulatory Visit: Payer: Self-pay

## 2020-03-06 ENCOUNTER — Other Ambulatory Visit: Payer: Self-pay | Admitting: Family Medicine

## 2020-03-06 ENCOUNTER — Encounter: Payer: Self-pay | Admitting: Family Medicine

## 2020-03-06 ENCOUNTER — Telehealth (INDEPENDENT_AMBULATORY_CARE_PROVIDER_SITE_OTHER): Payer: BC Managed Care – PPO | Admitting: Family Medicine

## 2020-03-06 DIAGNOSIS — J0100 Acute maxillary sinusitis, unspecified: Secondary | ICD-10-CM | POA: Diagnosis not present

## 2020-03-06 DIAGNOSIS — G43109 Migraine with aura, not intractable, without status migrainosus: Secondary | ICD-10-CM

## 2020-03-06 MED ORDER — AMOXICILLIN 500 MG PO CAPS: 500.0000 mg | ORAL_CAPSULE | Freq: Three times a day (TID) | ORAL | 0 refills | Status: DC

## 2020-03-06 NOTE — Progress Notes (Signed)
Patient ID: Vincent Solis, male    DOB: 1995-05-10, 24 y.o.   MRN: [id number]  Virtual Visit via Telephone Note  I connected with Vincent Solis on 03/06/20 at  4:10 PM EDT by telephone and verified that I am speaking with the correct person using two identifiers.  Location: Patient: home Provider: office   I discussed the limitations, risks, security and privacy concerns of performing an evaluation and management service by telephone and the availability of in person appointments. I also discussed with the patient that there may be a patient responsible charge related to this service. The patient expressed understanding and agreed to proceed.  Chief Complaint  Patient presents with  . cough and congestion    for a week- covid test negative   Subjective:    HPI Pt had phone visit for concern of coughing and congestion.  Has been going on for 7-10 days.  Having greenish discharge from nose.  Coughing occasionally.  Has been using mucinex, flonase, and ibuprofen prn. Mild relief.  Had covid testing 7 days ago and was negative.  People at work were sick with similar and negative for covid also. No sore throat or ear pain. When laying down at night worsening with cough and hard to breathe occasionally.  Medical History Vincent Solis has a past medical history of Hypertension, Low testosterone, Obesity, PONV (postoperative nausea and vomiting), and Seasonal allergies.   Outpatient Encounter Medications as of 03/06/2020  Medication Sig  . albuterol (VENTOLIN HFA) 108 (90 Base) MCG/ACT inhaler TAKE 2 PUFFS BY MOUTH 4 TIMES A DAY AS NEEDED FOR WHEEZE  . amoxicillin (AMOXIL) 500 MG capsule Take 1 capsule (500 mg total) by mouth 3 (three) times daily.  . busPIRone (BUSPAR) 5 MG tablet TAKE 1 TABLET BY MOUTH TWICE A DAY  . enalapril (VASOTEC) 10 MG tablet Take 1 tablet (10 mg total) by mouth daily.  . Multiple Vitamin (MULTIVITAMIN WITH MINERALS) TABS tablet Take 1 tablet by mouth daily. Reported  on 07/27/2015  . rizatriptan (MAXALT) 5 MG tablet Take 1 tablet (5 mg total) by mouth as needed for migraine. May repeat in 2 hours if needed   No facility-administered encounter medications on file as of 03/06/2020.     Review of Systems  Constitutional: Negative for chills and fever.  HENT: Positive for congestion and rhinorrhea. Negative for ear pain, sinus pressure, sinus pain, sneezing and sore throat.   Eyes: Negative for pain, discharge and itching.  Respiratory: Positive for cough. Negative for shortness of breath and wheezing.   Cardiovascular: Negative for chest pain and leg swelling.  Gastrointestinal: Negative for abdominal pain, diarrhea, nausea and vomiting.  Genitourinary: Negative for dysuria and frequency.  Skin: Negative for rash.  Neurological: Negative for dizziness, weakness and headaches.     Vitals There were no vitals taken for this visit.  Objective:   Physical Exam  No PE due to phone visit.  NAD, talking in full sentences, no respiratory distress.  Assessment and Plan   1. Acute non-recurrent maxillary sinusitis - amoxicillin (AMOXIL) 500 MG capsule; Take 1 capsule (500 mg total) by mouth 3 (three) times daily.  Dispense: 30 capsule; Refill: 0    covid test 7 days ago and is negative. Gave amoxicillin for 10 days, cont mucinex, flonase, and sinus rinses.   Call or rto if not improving after 2-3 days. Or if worsening. Pt in agreement.  F/u prn.    Follow Up Instructions:    I discussed the  assessment and treatment plan with the patient. The patient was provided an opportunity to ask questions and all were answered. The patient agreed with the plan and demonstrated an understanding of the instructions.   The patient was advised to call back or seek an in-person evaluation if the symptoms worsen or if the condition fails to improve as anticipated.  I provided 15 minutes of non-face-to-face time during this encounter.

## 2020-03-09 NOTE — Progress Notes (Signed)
NEUROLOGY CONSULTATION NOTE  Vincent Solis MRN: [id number] DOB: [birth year]  Referring provider: Laroy Apple, DO Primary care provider: Laroy Apple, DO  Reason for consult:  migraines  HISTORY OF PRESENT ILLNESS: Vincent Solis is a 24 year old right-handed male with HTN and anxiety who presents for migraines.  History supplemented by referring provider's note.  He had COVID in December 2020 with flu and bronchitis.  About 6 weeks later, he had right-sided Bell's palsy, which subsequently resolved.  He was admitted to [facility] on 10/05/2019 for right-sided facial numbness/eye twitching and right sided numbness and weakness which progressed over a few days.  He also had right-sided headache with dizziness and feeling off-balance but no spinning sensation.  MRI of brain/IAC with and without contrast personally reviewed were normal.  He underwent stroke workup.  CTA of head and neck personally reviewed were normal.  2D echocardiogram showed EF 50-55% with no cardiac source of embolus.  Hypercoagulable panel was unremarkable.  Lipid panel showed LDL of 95.  Hgb A1c was 5.1.  HIV and SARS Coronavirus testing negative.  Unclear if episode was related to anxiety.  Symptoms resolved after 2 days. In August 2021, he began experiencing right-sided posterior headaches associated with mild right upper extremity weakness and numbness and tingling of the 4th and 5th digits of his right hand, tightness in his right calf, blurred vision in his right eye, some nausea and photophobia.  Headaches would occur 45 minutes every couple of hours and numbness would occur off and on.  This would occur over 3 to 4 days and recur every 2 days.  No known triggers.  He has used rizatriptan with mild efficacy.   He has no personal history of headaches. His mother had history of migraines.  Current NSAIDS/analgesics:  none Current triptans:  Rizatriptan 5mg  Current ergotamine:  none Current anti-emetic:   none Current muscle relaxants:  none Current Antihypertensive medications:  Enalapril 10mg  daily Current Antidepressant medications:  none Current Anticonvulsant medications:  none Current anti-CGRP:  none Current Vitamins/Herbal/Supplements:  MVI Current Antihistamines/Decongestants:  none Other therapy:  none Hormone/birth control:  None Other medication:  Buspar  Past NSAIDS/analgesics:  none Past abortive triptans:  none Past abortive ergotamine:  none Past muscle relaxants:  none Past anti-emetic:  none Past antihypertensive medications:  none Past antidepressant medications:  none Past anticonvulsant medications:  none Past anti-CGRP:  none Past vitamins/Herbal/Supplements:  none Past antihistamines/decongestants:  none Other past therapies:  none   PAST MEDICAL HISTORY: Past Medical History:  Diagnosis Date  . Hypertension    being observed at present- will re evaluate 6 months after gastric sleeve surgery.  . Low testosterone   . Obesity   . PONV (postoperative nausea and vomiting)   . Seasonal allergies     PAST SURGICAL HISTORY: Past Surgical History:  Procedure Laterality Date  . APPENDECTOMY     Laparoscopic -age 25  . LAPAROSCOPIC GASTRIC SLEEVE RESECTION N/A 08/04/2015   Procedure: LAPAROSCOPIC GASTRIC SLEEVE RESECTION;  Surgeon: 5, MD;  Location: [facility];  Service: General;  Laterality: N/A;  . UPPER GI ENDOSCOPY N/A 08/04/2015   Procedure: UPPER GI ENDOSCOPY;  Surgeon: Luretha Murphy, MD;  Location: [facility];  Service: General;  Laterality: N/A;    MEDICATIONS: Current Outpatient Medications on File Prior to Visit  Medication Sig Dispense Refill  . albuterol (VENTOLIN HFA) 108 (90 Base) MCG/ACT inhaler TAKE 2 PUFFS BY MOUTH 4 TIMES A DAY AS NEEDED FOR WHEEZE  18 g 5  . amoxicillin (AMOXIL) 500 MG capsule Take 1 capsule (500 mg total) by mouth 3 (three) times daily. 30 capsule 0  . busPIRone (BUSPAR) 5 MG tablet TAKE 1 TABLET BY MOUTH TWICE A  DAY 60 tablet 2  . enalapril (VASOTEC) 10 MG tablet Take 1 tablet (10 mg total) by mouth daily. 30 tablet 5  . Multiple Vitamin (MULTIVITAMIN WITH MINERALS) TABS tablet Take 1 tablet by mouth daily. Reported on 07/27/2015    . rizatriptan (MAXALT) 5 MG tablet TAKE 1 TABLET BY MOUTH AS NEEDED FOR MIGRAINE. MAY REPEAT IN 2 HOURS IF NEEDED 8 tablet 1   No current facility-administered medications on file prior to visit.    ALLERGIES: Allergies  Allergen Reactions  . Sulfa Antibiotics Rash    FAMILY HISTORY: Family History  Problem Relation Age of Onset  . Mental illness Mother   . Hyperlipidemia Mother   . Melanoma Mother   . Hyperlipidemia Father   . GER disease Father   . Malignant hyperthermia Father   . Cancer Other   . Colitis Other     SOCIAL HISTORY: Social History   Socioeconomic History  . Marital status: Single    Spouse name: Not on file  . Number of children: Not on file  . Years of education: Not on file  . Highest education level: Not on file  Occupational History  . Not on file  Tobacco Use  . Smoking status: Never Smoker  . Smokeless tobacco: Never Used  Vaping Use  . Vaping Use: Never used  Substance and Sexual Activity  . Alcohol use: Yes    Comment: rare social  . Drug use: No  . Sexual activity: Not on file  Other Topics Concern  . Not on file  Social History Narrative  . Not on file   Social Determinants of Health   Financial Resource Strain:   . Difficulty of Paying Living Expenses: Not on file  Food Insecurity:   . Worried About Programme researcher, broadcasting/film/video in the Last Year: Not on file  . Ran Out of Food in the Last Year: Not on file  Transportation Needs:   . Lack of Transportation (Medical): Not on file  . Lack of Transportation (Non-Medical): Not on file  Physical Activity:   . Days of Exercise per Week: Not on file  . Minutes of Exercise per Session: Not on file  Stress:   . Feeling of Stress : Not on file  Social Connections:   .  Frequency of Communication with Friends and Family: Not on file  . Frequency of Social Gatherings with Friends and Family: Not on file  . Attends Religious Services: Not on file  . Active Member of Clubs or Organizations: Not on file  . Attends Banker Meetings: Not on file  . Marital Status: Not on file  Intimate Partner Violence:   . Fear of Current or Ex-Partner: Not on file  . Emotionally Abused: Not on file  . Physically Abused: Not on file  . Sexually Abused: Not on file   PHYSICAL EXAM: Blood pressure 138/83, pulse 74, resp. rate 18, height 6\' 5"  (1.956 m), weight (!) 337 lb (152.9 kg), SpO2 96 %. General: No acute distress.  Patient appears well-groomed.   Head:  Normocephalic/atraumatic Eyes:  fundi examined but not visualized Neck: supple, no paraspinal tenderness, full range of motion Back: No paraspinal tenderness Heart: regular rate and rhythm Lungs: Clear to auscultation bilaterally. Vascular: No  carotid bruits. Neurological Exam: Mental status: alert and oriented to person, place, and time, recent and remote memory intact, fund of knowledge intact, attention and concentration intact, speech fluent and not dysarthric, language intact. Cranial nerves: CN I: not tested CN II: pupils equal, round and reactive to light, visual fields intact CN III, IV, VI:  full range of motion, no nystagmus, no ptosis CN V: facial sensation intact CN VII: upper and lower face symmetric CN VIII: hearing intact CN IX, X: gag intact, uvula midline CN XI: sternocleidomastoid and trapezius muscles intact CN XII: tongue midline Bulk & Tone: normal, no fasciculations. Motor:  5/5 throughout  Sensation:  Pinprick and vibration sensation intact.  Positive Tinel's at right elbow. Deep Tendon Reflexes:  2+ throughout, toes downgoing.   Finger to nose testing:  Without dysmetria.   Heel to shin:  Without dysmetria.   Gait:  Normal station and stride.  Able to turn and tandem walk.  Romberg negative.  IMPRESSION: 1.  Probable migraine with aura, without status migrainosus, intractable.  I do not suspect stroke given his age and lack of risk factors (other than HTN).  He had COVID but several months earlier.  There has been reported onset of headaches and migraines after COVID.  Numbness and tingling in fingers consistent with ulnar neuropathy but as they occur only with headaches, likely symptom of migraine. 2.  Right-sided Bell's palsy, likely related to COVID  PLAN: 1.  Will start nortriptyline 25mg  at bedtime for migraine prophylaxis.  We can increase to 50mg  at bedtime in 6 weeks if needed. 2.  Triptans are contraindicated in patients with stroke-like symptoms with migraine.  Therefore, he will stop rizatriptan and I will have him try samples of Nurtec.  If effective, he will contact me for prescription.   3.  Limit use of pain relievers to no more than 2 days out of week to prevent risk of rebound or medication-overuse headache. 4.  Keep headache diary 5.  Follow up in 6 months.  Thank you for allowing me to take part in the care of this patient.  , DO  CC: , DO

## 2020-03-10 ENCOUNTER — Other Ambulatory Visit: Payer: Self-pay

## 2020-03-10 ENCOUNTER — Encounter: Payer: Self-pay | Admitting: Neurology

## 2020-03-10 ENCOUNTER — Ambulatory Visit: Payer: BC Managed Care – PPO | Admitting: Neurology

## 2020-03-10 VITALS — BP 138/83 | HR 74 | Resp 18 | Ht 77.0 in | Wt 337.0 lb

## 2020-03-10 DIAGNOSIS — G510 Bell's palsy: Secondary | ICD-10-CM | POA: Diagnosis not present

## 2020-03-10 DIAGNOSIS — G43119 Migraine with aura, intractable, without status migrainosus: Secondary | ICD-10-CM

## 2020-03-10 MED ORDER — NORTRIPTYLINE HCL 25 MG PO CAPS: 25.0000 mg | ORAL_CAPSULE | Freq: Every day | ORAL | 5 refills | Status: DC

## 2020-03-10 NOTE — Patient Instructions (Signed)
  1. Start nortriptyline 25mg  at bedtime.  Contact in 6 weeks with update and we can increase dose if needed. 2. STOP RIZATRIPTAN.  Take Nurtec 1 tablet at earliest onset of headache.  Maximum 1 tablet in 24 hours.   If effective, contact me for prescription. 3. Limit use of pain relievers to no more than 2 days out of the week.  These medications include acetaminophen, NSAIDs (ibuprofen/Advil/Motrin, naproxen/Aleve, triptans (Imitrex/sumatriptan), Excedrin, and narcotics.  This will help reduce risk of rebound headaches. 4. Be aware of common food triggers:  - Caffeine:  coffee, black tea, cola, Mt. Dew  - Chocolate  - Dairy:  aged cheeses (brie, blue, cheddar, gouda, [place], provolone, [place], Swiss, etc), chocolate milk, buttermilk, sour cream, limit eggs and yogurt  - Nuts, peanut butter  - Alcohol  - Cereals/grains:  FRESH breads (fresh bagels, sourdough, doughnuts), yeast productions  - Processed/canned/aged/cured meats (pre-packaged deli meats, hotdogs)  - MSG/glutamate:  soy sauce, flavor enhancer, pickled/preserved/marinated foods  - Sweeteners:  aspartame (Equal, Nutrasweet).  Sugar and Splenda are okay  - Vegetables:  legumes (lima beans, lentils, snow peas, fava beans, pinto peans, peas, garbanzo beans), sauerkraut, onions, olives, pickles  - Fruit:  avocados, bananas, citrus fruit (orange, lemon, grapefruit), mango  - Other:  Frozen meals, macaroni and cheese 5. Routine exercise 6. Stay adequately hydrated (aim for 64 oz water daily) 7. Keep headache diary 8. Maintain proper stress management 9. Maintain proper sleep hygiene 10. Do not skip meals 11. Consider supplements:  magnesium citrate 400mg  daily, riboflavin 400mg  daily, coenzyme Q10 100mg  three times daily.

## 2020-03-19 ENCOUNTER — Other Ambulatory Visit: Payer: BC Managed Care – PPO

## 2020-03-22 ENCOUNTER — Other Ambulatory Visit: Payer: Self-pay | Admitting: Family Medicine

## 2020-03-22 DIAGNOSIS — R200 Anesthesia of skin: Secondary | ICD-10-CM

## 2020-03-22 DIAGNOSIS — F419 Anxiety disorder, unspecified: Secondary | ICD-10-CM

## 2020-03-23 ENCOUNTER — Ambulatory Visit: Payer: BC Managed Care – PPO | Admitting: Family Medicine

## 2020-03-30 ENCOUNTER — Ambulatory Visit: Payer: BC Managed Care – PPO | Admitting: Cardiology

## 2020-04-09 DIAGNOSIS — Z20822 Contact with and (suspected) exposure to covid-19: Secondary | ICD-10-CM | POA: Diagnosis not present

## 2020-04-15 ENCOUNTER — Other Ambulatory Visit: Payer: Self-pay

## 2020-04-15 ENCOUNTER — Ambulatory Visit (INDEPENDENT_AMBULATORY_CARE_PROVIDER_SITE_OTHER): Payer: BC Managed Care – PPO

## 2020-04-15 DIAGNOSIS — R079 Chest pain, unspecified: Secondary | ICD-10-CM | POA: Diagnosis not present

## 2020-04-15 LAB — ECHOCARDIOGRAM COMPLETE
AR max vel: 4.06 cm2
AV Area VTI: 3.99 cm2
AV Area mean vel: 3.49 cm2
AV Mean grad: 3 mmHg
AV Peak grad: 5.4 mmHg
Ao pk vel: 1.16 m/s
Area-P 1/2: 3.15 cm2
Calc EF: 48.1 %
S' Lateral: 4.3 cm
Single Plane A2C EF: 47.2 %
Single Plane A4C EF: 49 %

## 2020-04-15 MED ORDER — PERFLUTREN LIPID MICROSPHERE
1.0000 mL | INTRAVENOUS | Status: AC | PRN
  Administered 2020-04-15: 2 mL via INTRAVENOUS

## 2020-04-17 ENCOUNTER — Other Ambulatory Visit: Payer: Self-pay

## 2020-04-17 ENCOUNTER — Ambulatory Visit (INDEPENDENT_AMBULATORY_CARE_PROVIDER_SITE_OTHER): Payer: BC Managed Care – PPO | Admitting: Cardiology

## 2020-04-17 ENCOUNTER — Encounter: Payer: Self-pay | Admitting: Cardiology

## 2020-04-17 VITALS — BP 128/84 | HR 83 | Ht 77.0 in | Wt 332.0 lb

## 2020-04-17 DIAGNOSIS — Z6839 Body mass index (BMI) 39.0-39.9, adult: Secondary | ICD-10-CM | POA: Diagnosis not present

## 2020-04-17 DIAGNOSIS — I10 Essential (primary) hypertension: Secondary | ICD-10-CM

## 2020-04-17 DIAGNOSIS — R079 Chest pain, unspecified: Secondary | ICD-10-CM

## 2020-04-17 NOTE — Progress Notes (Signed)
Cardiology Office Note:    Date:  04/17/2020   ID:  Vincent Solis, DOB November 19, 1995, MRN [id number]  PCP:  Vincent Genta, DO  [facility] Cardiologist:  No primary care provider on file.  [facility] Electrophysiologist:  None   Referring MD: Vincent Genta, DO   Chief Complaint  Patient presents with  . Follow-up    After echo  Pt states no new or worsening Sx      History of Present Illness:    Vincent Solis is a 24 y.o. male with a hx of hypertension, obesity who presents for follow-up.  Patient last seen due to chest pain associated with exertion.  Symptoms of chest discomfort actually improved with exertion such as running on a treadmill.  Echo was performed to evaluate any cardiac dysfunction or wall motion abnormalities.  Denies shortness of breath.  Overall his symptoms of chest discomfort have improved.  He thinks a component of anxiety could be contributing.  Past Medical History:  Diagnosis Date  . Hypertension    being observed at present- will re evaluate 6 months after gastric sleeve surgery.  . Low testosterone   . Obesity   . PONV (postoperative nausea and vomiting)   . Seasonal allergies     Past Surgical History:  Procedure Laterality Date  . APPENDECTOMY     Laparoscopic -age 63  . LAPAROSCOPIC GASTRIC SLEEVE RESECTION N/A 08/04/2015   Procedure: LAPAROSCOPIC GASTRIC SLEEVE RESECTION;  Surgeon: Vincent Murphy, MD;  Location: [facility];  Service: General;  Laterality: N/A;  . UPPER GI ENDOSCOPY N/A 08/04/2015   Procedure: UPPER GI ENDOSCOPY;  Surgeon: Vincent Murphy, MD;  Location: [facility];  Service: General;  Laterality: N/A;    Current Medications: Current Meds  Medication Sig  . albuterol (VENTOLIN HFA) 108 (90 Base) MCG/ACT inhaler TAKE 2 PUFFS BY MOUTH 4 TIMES A DAY AS NEEDED FOR WHEEZE  . amoxicillin (AMOXIL) 500 MG capsule Take 1 capsule (500 mg total) by mouth 3 (three) times daily.  . busPIRone (BUSPAR) 5 MG tablet TAKE 1 TABLET BY MOUTH  TWICE A DAY  . enalapril (VASOTEC) 10 MG tablet Take 1 tablet (10 mg total) by mouth daily.  . Multiple Vitamin (MULTIVITAMIN WITH MINERALS) TABS tablet Take 1 tablet by mouth daily. Reported on 07/27/2015  . nortriptyline (PAMELOR) 25 MG capsule Take 1 capsule (25 mg total) by mouth at bedtime.     Allergies:   Sulfa antibiotics   Social History   Socioeconomic History  . Marital status: Single    Spouse name: Not on file  . Number of children: Not on file  . Years of education: Not on file  . Highest education level: Not on file  Occupational History  . Not on file  Tobacco Use  . Smoking status: Never Smoker  . Smokeless tobacco: Never Used  Vaping Use  . Vaping Use: Never used  Substance and Sexual Activity  . Alcohol use: Yes    Comment: rare social  . Drug use: No  . Sexual activity: Not on file  Other Topics Concern  . Not on file  Social History Narrative   Right handed   Two story home   Drinks caffeine rarely   Social Determinants of Health   Financial Resource Strain: Not on file  Food Insecurity: Not on file  Transportation Needs: Not on file  Physical Activity: Not on file  Stress: Not on file  Social Connections: Not on file  Family History: The patient's family history includes Cancer in an other family member; Colitis in an other family member; GER disease in his father; Hyperlipidemia in his father and mother; Malignant hyperthermia in his father; Melanoma in his mother; Mental illness in his mother.  ROS:   Please see the history of present illness.     All other systems reviewed and are negative.  EKGs/Labs/Other Studies Reviewed:    The following studies were reviewed today:   EKG:  EKG not  ordered today.    Recent Labs: 12/12/2019: ALT 45 02/12/2020: BUN 9; Creatinine, Ser 0.77; Hemoglobin 16.2; Platelets 289; Potassium 3.7; Sodium 138  Recent Lipid Panel    Component Value Date/Time   CHOL 139 10/06/2019 0252   TRIG 71 10/06/2019  0252   HDL 30 (L) 10/06/2019 0252   CHOLHDL 4.6 10/06/2019 0252   VLDL 14 10/06/2019 0252   LDLCALC 95 10/06/2019 0252     Risk Assessment/Calculations:      Physical Exam:    VS:  BP 128/84   Pulse 83   Ht 6\' 5"  (1.956 m)   Wt (!) 332 lb (150.6 kg)   BMI 39.37 kg/m     Wt Readings from Last 3 Encounters:  04/17/20 (!) 332 lb (150.6 kg)  03/10/20 (!) 337 lb (152.9 kg)  03/03/20 (!) 333 lb (151 kg)     GEN:  Well nourished, well developed in no acute distress HEENT: Normal NECK: No JVD; No carotid bruits LYMPHATICS: No lymphadenopathy CARDIAC: RRR, no murmurs, rubs, gallops RESPIRATORY:  Clear to auscultation without rales, wheezing or rhonchi  ABDOMEN: Soft, non-tender, non-distended MUSCULOSKELETAL:  No edema; No deformity  SKIN: Warm and dry NEUROLOGIC:  Alert and oriented x 3 PSYCHIATRIC:  Normal affect   ASSESSMENT:    1. Chest pain of uncertain etiology   2. Primary hypertension   3. BMI 39.0-39.9,adult    PLAN:    In order of problems listed above:  1. Patient chest pain, risk factors hypertension, obesity.  Symptoms not consistent with cardiac etiology.  Echo reviewed by myself shows low normal systolic function, EF 50%.  Impaired relaxation likely from obesity and hypertension.  Patient made aware of results.  No indications for stress testing at this time. 2. History of hypertension, BP controlled.  Continue Vasotec. 3. Obesity, exercise, diet, weight loss advised.  I believe weight loss will help patient's blood pressure and overall cardiovascular health.  Over 5 minutes spent counseling patient.  Follow-up as needed   Medication Adjustments/Labs and Tests Ordered: Current medicines are reviewed at length with the patient today.  Concerns regarding medicines are outlined above.  No orders of the defined types were placed in this encounter.  No orders of the defined types were placed in this encounter.   Patient Instructions  Medication  Instructions:  Your physician recommends that you continue on your current medications as directed. Please refer to the Current Medication list given to you today.  *If you need a refill on your cardiac medications before your next appointment, please call your pharmacy*   Lab Work: None ordered If you have labs (blood work) drawn today and your tests are completely normal, you will receive your results only by: 01-21-1989 MyChart Message (if you have MyChart) OR . A paper copy in the mail If you have any lab test that is abnormal or we need to change your treatment, we will call you to review the results.   Testing/Procedures: None ordered   Follow-Up:  At [facility], you and your health needs are our priority.  As part of our continuing mission to provide you with exceptional heart care, we have created designated Provider Care Teams.  These Care Teams include your primary Cardiologist (physician) and Advanced Practice Providers (APPs -  Physician Assistants and Nurse Practitioners) who all work together to provide you with the care you need, when you need it.  We recommend signing up for the patient portal called "MyChart".  Sign up information is provided on this After Visit Summary.  MyChart is used to connect with patients for Virtual Visits (Telemedicine).  Patients are able to view lab/test results, encounter notes, upcoming appointments, etc.  Non-urgent messages can be sent to your provider as well.   To learn more about what you can do with MyChart, go to [email or web address].    Your next appointment:   As needed   The format for your next appointment:   In Person  Provider:   You may see Dr. Azucena Cecil or one of the following Advanced Practice Providers on your designated Care Team:    Nicolasa Ducking, NP  Eula Listen, PA-C  Marisue Ivan, PA-C  Cadence [place], [place]  Gillian Shields, NP    Other Instructions N/A     Signed, Debbe Odea, MD   04/17/2020 12:53 PM    [facility]

## 2020-04-17 NOTE — Patient Instructions (Signed)
Medication Instructions:  Your physician recommends that you continue on your current medications as directed. Please refer to the Current Medication list given to you today.  *If you need a refill on your cardiac medications before your next appointment, please call your pharmacy*   Lab Work: None ordered If you have labs (blood work) drawn today and your tests are completely normal, you will receive your results only by: . MyChart Message (if you have MyChart) OR . A paper copy in the mail If you have any lab test that is abnormal or we need to change your treatment, we will call you to review the results.   Testing/Procedures: None ordered   Follow-Up: At [facility], you and your health needs are our priority.  As part of our continuing mission to provide you with exceptional heart care, we have created designated Provider Care Teams.  These Care Teams include your primary Cardiologist (physician) and Advanced Practice Providers (APPs -  Physician Assistants and Nurse Practitioners) who all work together to provide you with the care you need, when you need it.  We recommend signing up for the patient portal called "MyChart".  Sign up information is provided on this After Visit Summary.  MyChart is used to connect with patients for Virtual Visits (Telemedicine).  Patients are able to view lab/test results, encounter notes, upcoming appointments, etc.  Non-urgent messages can be sent to your provider as well.   To learn more about what you can do with MyChart, go to [email or web address].    Your next appointment:   As needed   The format for your next appointment:   In Person  Provider:   You may see Dr. Agbor-Etang or one of the following Advanced Practice Providers on your designated Care Team:    Christopher Berge, NP  Jahi Dunn, PA-C  Jacquelyn Visser, PA-C  Cadence Furth, PA-C  Caitlin Walker, NP    Other Instructions N/A  

## 2020-05-11 ENCOUNTER — Encounter: Payer: Self-pay | Admitting: Family Medicine

## 2020-05-11 ENCOUNTER — Telehealth (INDEPENDENT_AMBULATORY_CARE_PROVIDER_SITE_OTHER): Payer: BC Managed Care – PPO | Admitting: Family Medicine

## 2020-05-11 ENCOUNTER — Telehealth: Payer: Self-pay | Admitting: Family Medicine

## 2020-05-11 DIAGNOSIS — Z20822 Contact with and (suspected) exposure to covid-19: Secondary | ICD-10-CM

## 2020-05-11 DIAGNOSIS — J019 Acute sinusitis, unspecified: Secondary | ICD-10-CM | POA: Diagnosis not present

## 2020-05-11 MED ORDER — PREDNISONE 20 MG PO TABS: ORAL_TABLET | ORAL | 0 refills | Status: DC

## 2020-05-11 MED ORDER — AZITHROMYCIN 250 MG PO TABS: ORAL_TABLET | ORAL | 0 refills | Status: DC

## 2020-05-11 NOTE — Telephone Encounter (Signed)
[name], kestler are scheduled for a virtual visit with your provider today.    Just as we do with appointments in the office, we must obtain your consent to participate.  Your consent will be active for this visit and any virtual visit you may have with one of our providers in the next 365 days.    If you have a MyChart account, I can also send a copy of this consent to you electronically.  All virtual visits are billed to your insurance company just like a traditional visit in the office.  As this is a virtual visit, video technology does not allow for your provider to perform a traditional examination.  This may limit your provider's ability to fully assess your condition.  If your provider identifies any concerns that need to be evaluated in person or the need to arrange testing such as labs, EKG, etc, we will make arrangements to do so.    Although advances in technology are sophisticated, we cannot ensure that it will always work on either your end or our end.  If the connection with a video visit is poor, we may have to switch to a telephone visit.  With either a video or telephone visit, we are not always able to ensure that we have a secure connection.   I need to obtain your verbal consent now.   Are you willing to proceed with your visit today?   Vincent Solis has provided verbal consent on 05/11/2020 for a virtual visit (video or telephone).   [name], LPN 10/11/7844  9:62 PM

## 2020-05-11 NOTE — Progress Notes (Signed)
   Subjective:    Patient ID: Vincent Solis, male    DOB: [birth year], 25 y.o.   MRN: [id number]  HPI  Patient presents today with respiratory illness Number of days present-2 days ago, cough worsened last night  Symptoms include- cough, sore throat He denies any shortness of breath currently Presence of worrisome signs (severe shortness of breath, lethargy, etc.) - none  Recent/current visit to urgent care or ER- none  Recent direct exposure to Covid- yes; outbreak at work   Any current Covid testing- tested this morning   Virtual Visit via Video Note  I connected with Vincent Solis on 05/11/20 at  4:10 PM EST by a video enabled telemedicine application and verified that I am speaking with the correct person using two identifiers.  Location: Patient: home Provider: office   I discussed the limitations of evaluation and management by telemedicine and the availability of in person appointments. The patient expressed understanding and agreed to proceed.  History of Present Illness:    Observations/Objective:   Assessment and Plan:   Follow Up Instructions:    I discussed the assessment and treatment plan with the patient. The patient was provided an opportunity to ask questions and all were answered. The patient agreed with the plan and demonstrated an understanding of the instructions.   The patient was advised to call back or seek an in-person evaluation if the symptoms worsen or if the condition fails to improve as anticipated.  I provided 20 minutes of non-face-to-face time during this encounter.       Review of Systems     Objective:   Physical Exam Patient had virtual visit-video Appears to be in no distress Atraumatic Neuro able to relate and oriented No apparent resp distress Color normal        Assessment & Plan:  Viral syndrome Possible Covid Awaiting test Patient will tell us the results Patient very concerned that this is going into  bronchitis and sinusitis because of the pressure in his face and the cough he has requested antibiotics and a short course prednisone because he has a history of reactive airway this was granted to him with the stipulation that he will keep [place] closely updated how he is doing  Because of suspected Covid he was warned regarding Covid symptoms as the following  Covid infection-suspected process will wait testing patient will let us know the result This is a viral process.  Mild cases are treated with supportive measures at home such as Tylenol rest fluids.  In some situations monoclonal antibodies may be appropriate depending on the patient's risk criteria.  The patient was educated regarding progressive illness including respiratory, persistent vomiting, change in mental status.  If any of these occur ER evaluation is recommended. Patient was educated about the following as well Covid-19 respiratory warning: Covid-19 is a virus that causes hypoxia (low oxygen level in blood) in some people. If you develop any changes in your usual breathing pattern: difficulty catching your breath, more short winded with activity or with resting, or anything that concerns you about your breathing, do not hesitate to go to the emergency department immediately for evaluation. Please do not delay to get treatment.   Agrees with plan of care discussed today. Understands warning signs to seek further care: Chest pain, shortness of breath, mental confusion, profuse vomiting, any significant change in health. Understands to follow-up if symptoms do not improve, or worsen.

## 2020-05-12 ENCOUNTER — Other Ambulatory Visit: Payer: Self-pay | Admitting: *Deleted

## 2020-05-12 MED ORDER — VALACYCLOVIR HCL 1 G PO TABS: ORAL_TABLET | ORAL | 0 refills | Status: DC

## 2020-05-12 NOTE — Telephone Encounter (Signed)
Called pt to see how he was this morning since this message came inafter hours yesterday and he states the numbness and drooping are better. States he sees a neurologist and when he gets viral illness he sometimes getss bells palsy and prednisone does help. He did get the prednisone yesterday and states he is better and does not need anything at this time. Will follow up if any problems.

## 2020-05-12 NOTE — Telephone Encounter (Signed)
There is also a occasional association with viruses triggering this that can respond to Valtrex  Therefore it is reasonable to also do Valtrex 1 g, 1 pill taken 3 times daily for the next 5 days  If the Bell's palsy progresses or persists very important for the patient to let us know It should resolve over the course of the next 7 to 10 days

## 2020-05-13 ENCOUNTER — Encounter: Payer: Self-pay | Admitting: Family Medicine

## 2020-05-14 ENCOUNTER — Encounter: Payer: Self-pay | Admitting: Family Medicine

## 2020-05-14 ENCOUNTER — Other Ambulatory Visit: Payer: Self-pay | Admitting: *Deleted

## 2020-05-14 MED ORDER — ALBUTEROL SULFATE HFA 108 (90 BASE) MCG/ACT IN AERS: INHALATION_SPRAY | RESPIRATORY_TRACT | 2 refills | Status: AC

## 2020-05-14 NOTE — Telephone Encounter (Signed)
May go ahead and send in 3 refills on the inhaler  Also find out from patient how is he doing currently?  How is his breathing any setbacks?  Does he seem to be turning the corner?

## 2020-07-07 ENCOUNTER — Other Ambulatory Visit: Payer: Self-pay | Admitting: Family Medicine

## 2020-07-08 ENCOUNTER — Other Ambulatory Visit: Payer: Self-pay | Admitting: Family Medicine

## 2020-07-08 NOTE — Telephone Encounter (Signed)
Hi, normally don't refill on prednisone, what was he taking it for?  Also what was he needing refill on valtrex for?  If his bell's palsy back, then needs appt.  Thx.   Dr. Ladona Ridgel

## 2020-07-09 ENCOUNTER — Ambulatory Visit
Admission: EM | Admit: 2020-07-09 | Discharge: 2020-07-09 | Disposition: A | Discharge status: Home / Self Care | Payer: BC Managed Care – PPO | Attending: Family Medicine | Admitting: Family Medicine

## 2020-07-09 ENCOUNTER — Other Ambulatory Visit: Payer: Self-pay

## 2020-07-09 DIAGNOSIS — R200 Anesthesia of skin: Secondary | ICD-10-CM

## 2020-07-09 MED ORDER — VALACYCLOVIR HCL 1 G PO TABS: 1000.0000 mg | ORAL_TABLET | Freq: Three times a day (TID) | ORAL | 0 refills | Status: AC

## 2020-07-09 MED ORDER — PREDNISONE 10 MG PO TABS: 60.0000 mg | ORAL_TABLET | Freq: Every day | ORAL | 0 refills | Status: AC

## 2020-07-09 NOTE — Discharge Instructions (Addendum)
Take the medication as prescribed °Follow up as needed for continued or worsening symptoms ° °

## 2020-07-09 NOTE — ED Provider Notes (Signed)
Vincent Solis    CSN: [id number] Arrival date & time: 07/09/20  1024      History   Chief Complaint Chief Complaint  Patient presents with  . poss bells palsy    HPI Vincent Solis is a 25 y.o. male.   Pt is a 25 year old male that presents with right sided facial numbness, tingling. Mild right facial droop. Hx of Bells Palsy recurrent. Denies change in vision, arm/leg or other weakness, difficulty with mentation, or change in speech. Also has hx of migraines. Takes nortriptyline.       Past Medical History:  Diagnosis Date  . Hypertension    being observed at present- will re evaluate 6 months after gastric sleeve surgery.  . Low testosterone   . Obesity   . PONV (postoperative nausea and vomiting)   . Seasonal allergies     Patient Active Problem List   Diagnosis Date Noted  . 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 TIA (transient ischemic attack) 10/05/2019  . 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 Morbid obesity (HCC) 05/29/2015  . Tonsillar hypertrophy 05/13/2015  . Essential hypertension, benign 07/27/2013  . Fasting hyperinsulinemia with normal glucose 07/27/2013  . Sprain of ankle, unspecified site 11/19/2012  . Low testosterone   . Seasonal allergies     Past Surgical History:  Procedure Laterality Date  . APPENDECTOMY     Laparoscopic -age 22  . LAPAROSCOPIC GASTRIC SLEEVE RESECTION N/A 08/04/2015   Procedure: LAPAROSCOPIC GASTRIC SLEEVE RESECTION;  Surgeon: Luretha Murphy, MD;  Location: [facility];  Service: General;  Laterality: N/A;  . UPPER GI ENDOSCOPY N/A 08/04/2015   Procedure: UPPER GI ENDOSCOPY;  Surgeon: Luretha Murphy, MD;  Location: [facility];  Service: General;  Laterality: N/A;       Home Medications    Prior to Admission medications   Medication Sig Start Date End Date Taking? Authorizing Provider  enalapril (VASOTEC) 10 MG tablet Take 1 tablet (10 mg total) by mouth daily. 08/01/19  Yes Merlyn Albert, MD   nortriptyline (PAMELOR) 25 MG capsule Take 1 capsule (25 mg total) by mouth at bedtime. 03/10/20  Yes Jaffe, Adam R, DO  predniSONE (DELTASONE) 10 MG tablet Take 6 tablets (60 mg total) by mouth daily for 7 days. 07/09/20 07/16/20 Yes Tashianna Broome A, NP  valACYclovir (VALTREX) 1000 MG tablet Take 1 tablet (1,000 mg total) by mouth 3 (three) times daily for 7 days. 07/09/20 07/16/20 Yes Dannie Hattabaugh A, NP  albuterol (VENTOLIN HFA) 108 (90 Base) MCG/ACT inhaler TAKE 2 PUFFS BY MOUTH 4 TIMES A DAY AS NEEDED FOR WHEEZE 05/14/20   Babs Sciara, MD  Multiple Vitamin (MULTIVITAMIN WITH MINERALS) TABS tablet Take 1 tablet by mouth daily. Reported on 07/27/2015    [provider]    Family History Family History  Problem Relation Age of Onset  . Mental illness Mother   . Hyperlipidemia Mother   . Melanoma Mother   . Hyperlipidemia Father   . GER disease Father   . Malignant hyperthermia Father   . Cancer Other   . Colitis Other     Social History Social History   Tobacco Use  . Smoking status: Never Smoker  . Smokeless tobacco: Never Used  Vaping Use  . Vaping Use: Never used  Substance Use Topics  . Alcohol use: Yes    Comment: rare social  . Drug use: No     Allergies   Sulfa antibiotics   Review  of Systems Review of Systems   Physical Exam Triage Vital Signs ED Triage Vitals [07/09/20 1046]  Enc Vitals Group     BP (!) 153/98     Pulse Rate 92     Resp 20     Temp 98.3 F (36.8 C)     Temp Source Oral     SpO2 97 %     Weight      Height      Head Circumference      Peak Flow      Pain Score      Pain Loc      Pain Edu?      Excl. in GC?    No data found.  Updated Vital Signs BP (!) 153/98 (BP Location: Left Arm)   Pulse 92   Temp 98.3 F (36.8 C) (Oral)   Resp 20   SpO2 97%   Visual Acuity Right Eye Distance:   Left Eye Distance:   Bilateral Distance:    Right Eye Near:   Left Eye Near:    Bilateral Near:     Physical Exam Vitals and  nursing note reviewed.  Constitutional:      Appearance: Normal appearance.  HENT:     Head: Normocephalic and atraumatic.     Nose: Nose normal.  Eyes:     Conjunctiva/sclera: Conjunctivae normal.  Pulmonary:     Effort: Pulmonary effort is normal.  Musculoskeletal:        General: Normal range of motion.     Cervical back: Normal range of motion.  Skin:    General: Skin is warm and dry.  Neurological:     Mental Status: He is alert.     Comments: Mild right facial droop and palsy noted. Able to raise eyebrow and close eye with force.   Psychiatric:        Mood and Affect: Mood normal.      UC Treatments / Results  Labs (all labs ordered are listed, but only abnormal results are displayed) Labs Reviewed - No data to display  EKG   Radiology No results found.  Procedures Procedures (including critical care time)  Medications Ordered in UC Medications - No data to display  Initial Impression / Assessment and Plan / UC Course  I have reviewed the triage vital signs and the nursing notes.  Pertinent labs & imaging results that were available during my care of the patient were reviewed by me and considered in my medical decision making (see chart for details).     Right facial numbness and mild palsy. Hx of recurrent Bell's Palsy. Will treat for that at this time.  Prednisone and valtrex prescribed.   ER precautions given.  Follow up as needed for continued or worsening symptoms  Final Clinical Impressions(s) / UC Diagnoses   Final diagnoses:  Right facial numbness     Discharge Instructions     Take the medication as prescribed Follow up as needed for continued or worsening symptoms     ED Prescriptions    Medication Sig Dispense Auth. Provider   predniSONE (DELTASONE) 10 MG tablet Take 6 tablets (60 mg total) by mouth daily for 7 days. 42 tablet Kathryn Linarez A, NP   valACYclovir (VALTREX) 1000 MG tablet Take 1 tablet (1,000 mg total) by mouth 3  (three) times daily for 7 days. 21 tablet Bryann Gentz A, NP     PDMP not reviewed this encounter.   Janace Aris, NP 07/09/20  1119  

## 2020-07-09 NOTE — ED Triage Notes (Signed)
Pt c/o right sided head tingling, pain, numbness to head, face since yesterday. Pt states h/o Bells palsy.  Right sided facial/mouth droop noted.   Pt denies change in vision, arm/leg or other weakness, difficulty with mentation, or change in speech.

## 2020-07-09 NOTE — Telephone Encounter (Signed)
Bells palsy symptoms. Pt states he cannot come in today due to work. Advised him to go to urgent care if not able to come in today. Pt states he will.

## 2020-08-06 MED ORDER — ENALAPRIL MALEATE 10 MG PO TABS
10.0000 mg | ORAL_TABLET | Freq: Every day | ORAL | 0 refills | Status: DC
Start: 2020-08-06 — End: 2020-09-07

## 2020-08-06 NOTE — Telephone Encounter (Signed)
The medication was filled by Dr. Brett Canales, since I haven't filled the medication we were not receiving the refill requst.  You will need to follow up with me since our last visit together was 74mo ago. Will give 30 days supply and pls come in for follow up.  Thx. Dr .Ladona Ridgel

## 2020-08-31 ENCOUNTER — Other Ambulatory Visit: Payer: Self-pay | Admitting: Neurology

## 2020-09-05 ENCOUNTER — Other Ambulatory Visit: Payer: Self-pay | Admitting: Family Medicine

## 2020-09-06 NOTE — Progress Notes (Signed)
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  Nortriptyline 25mg  at bedtime refilled 2.  Limit use of pain relievers to no more than 2 days out of week to prevent risk of rebound or medication-overuse headache. 3.  Keep headache diary 4.  Follow up 6 months.  Subjective:  Kj A. Bursch is a 25 year old right-handed male with HTN, anxiety and history of right-sided Bell's palsy who follows up for migraine with aura.  UPDATE: Started nortriptyline in November. Improved.  Nurtec somewhat helpful but manageable with Tylenol or untreated. Intensity:  mild Duration:  1 to 2 hours Frequency:  Once a month In March, he experienced right sided facial numbness with mild facial droop.  Went to the ED.  Treated for recurrent Bell's palsy with prednisone and Valtrex.  Not nearly as severe as previous.  Resolved after a couple of days.  Frequency of abortive medication: 1 a month Current NSAIDS/analgesics:  2 Tylenol Current triptans:  contraindicated Current ergotamine:  contraindicated Current anti-emetic:  none Current muscle relaxants:  none Current Antihypertensive medications:  Enalapril 10mg  daily Current Antidepressant medications:  nortriptyline 25mg  QHS Current Anticonvulsant medications:  none Current anti-CGRP:  Nurtec (rescue) Current Vitamins/Herbal/Supplements:  MVI Current Antihistamines/Decongestants:  none Other therapy:  none Hormone/birth control:  None Other medication:  Buspar  Caffeine:  Cut back to just 1 cup of coffee  HISTORY: He had COVID in December 2020 with flu and bronchitis.  About 6 weeks later, he had right-sided Bell's palsy, which subsequently resolved.  He was admitted to [facility] on 10/05/2019 for right-sided facial numbness/eye twitching and right sided numbness and  weakness which progressed over a few days.  He also had right-sided headache with dizziness and feeling off-balance but no spinning sensation.  MRI of brain/IAC with and without contrast personally reviewed were normal.  He underwent stroke workup.  CTA of head and neck personally reviewed were normal.  2D echocardiogram showed EF 50-55% with no cardiac source of embolus.  Hypercoagulable panel was unremarkable.  Lipid panel showed LDL of 95.  Hgb A1c was 5.1.  HIV and SARS Coronavirus testing negative.  Unclear if episode was related to anxiety.  Symptoms resolved after 2 days. In August 2021, he began experiencing right-sided posterior headaches associated with mild right upper extremity weakness and numbness and tingling of the 4th and 5th digits of his right hand, tightness in his right calf, blurred vision in his right eye, some nausea and photophobia.  Headaches would occur 45 minutes every couple of hours and numbness would occur off and on.  This would occur over 3 to 4 days and recur every 2 days.  No known triggers.  He has used rizatriptan with mild efficacy.   He has no personal history of headaches. His mother had history of migraines.    Past NSAIDS/analgesics:  none Past abortive triptans:  rizatriptan Past abortive ergotamine:  none Past muscle relaxants:  none Past anti-emetic:  none Past antihypertensive medications:  none Past antidepressant medications:  none Past anticonvulsant medications:  none Past anti-CGRP:  none Past vitamins/Herbal/Supplements:  none Past antihistamines/decongestants:  none Other past therapies:  none  PAST MEDICAL HISTORY: Past Medical History:  Diagnosis Date  . Hypertension    being observed at present- will re evaluate 6 months after gastric sleeve surgery.  . Low testosterone   .  Obesity   . PONV (postoperative nausea and vomiting)   . Seasonal allergies     MEDICATIONS: Current Outpatient Medications on File Prior to Visit   Medication Sig Dispense Refill  . albuterol (VENTOLIN HFA) 108 (90 Base) MCG/ACT inhaler TAKE 2 PUFFS BY MOUTH 4 TIMES A DAY AS NEEDED FOR WHEEZE 18 g 2  . enalapril (VASOTEC) 10 MG tablet Take 1 tablet (10 mg total) by mouth daily. 30 tablet 0  . Multiple Vitamin (MULTIVITAMIN WITH MINERALS) TABS tablet Take 1 tablet by mouth daily. Reported on 07/27/2015    . nortriptyline (PAMELOR) 25 MG capsule Take 1 capsule (25 mg total) by mouth at bedtime. 30 capsule 5   No current facility-administered medications on file prior to visit.    ALLERGIES: Allergies  Allergen Reactions  . Sulfa Antibiotics Rash    FAMILY HISTORY: Family History  Problem Relation Age of Onset  . Mental illness Mother   . Hyperlipidemia Mother   . Melanoma Mother   . Hyperlipidemia Father   . GER disease Father   . Malignant hyperthermia Father   . Cancer Other   . Colitis Other       Objective:  Blood pressure 137/78, pulse 99, height 6\' 5"  (1.956 m), weight (!) 335 lb 12.8 oz (152.3 kg), SpO2 93 %. General: No acute distress.  Patient appears well-groomed.   Head:  Normocephalic/atraumatic Eyes:  Fundi examined but not visualized Neck: supple, no paraspinal tenderness, full range of motion Heart:  Regular rate and rhythm Lungs:  Clear to auscultation bilaterally Back: No paraspinal tenderness Neurological Exam: alert and oriented to person, place, and time. Speech fluent and not dysarthric, language intact.  CN II-XII intact. Bulk and tone normal, muscle strength 5/5 throughout.  Sensation to light touch intact.  Deep tendon reflexes 2+ throughout, toes downgoing.  Finger to nose intact.  Gait normal, Romberg negative.     , DO  CC: Shon Millet, DO

## 2020-09-07 ENCOUNTER — Ambulatory Visit: Payer: BC Managed Care – PPO | Admitting: Neurology

## 2020-09-07 ENCOUNTER — Encounter: Payer: Self-pay | Admitting: Neurology

## 2020-09-07 ENCOUNTER — Other Ambulatory Visit: Payer: Self-pay

## 2020-09-07 VITALS — BP 137/78 | HR 99 | Ht 77.0 in | Wt 335.8 lb

## 2020-09-07 DIAGNOSIS — G43109 Migraine with aura, not intractable, without status migrainosus: Secondary | ICD-10-CM

## 2020-09-07 DIAGNOSIS — G510 Bell's palsy: Secondary | ICD-10-CM

## 2020-09-07 MED ORDER — NORTRIPTYLINE HCL 25 MG PO CAPS
25.0000 mg | ORAL_CAPSULE | Freq: Every day | ORAL | 5 refills | Status: DC
Start: 2020-09-07 — End: 2021-03-16

## 2020-09-07 MED ORDER — ENALAPRIL MALEATE 10 MG PO TABS
10.0000 mg | ORAL_TABLET | Freq: Every day | ORAL | 0 refills | Status: DC
Start: 2020-09-07 — End: 2020-10-15

## 2020-09-07 NOTE — Patient Instructions (Signed)
1.  Continue nortriptyline 25mg  at bedtime 2.  Limit use of pain relievers to no more than 2 days out of week to prevent risk of rebound or medication-overuse headache. 3.  Keep headache diary 4.  Follow up 6 months.

## 2020-09-07 NOTE — Addendum Note (Signed)
Addended by: Annalee Genta on: 09/07/2020 07:01 PM   Modules accepted: Orders

## 2020-09-08 NOTE — Telephone Encounter (Signed)
Please call patient to have him set up appt in next 2 weeks. Thank you

## 2020-09-08 NOTE — Telephone Encounter (Signed)
Sent my chart message to schedule appointment.

## 2020-10-08 ENCOUNTER — Ambulatory Visit: Payer: BC Managed Care – PPO | Admitting: Family Medicine

## 2020-10-15 ENCOUNTER — Ambulatory Visit: Payer: BC Managed Care – PPO | Admitting: Family Medicine

## 2020-10-15 ENCOUNTER — Other Ambulatory Visit: Payer: Self-pay

## 2020-10-15 ENCOUNTER — Encounter: Payer: Self-pay | Admitting: Family Medicine

## 2020-10-15 VITALS — BP 142/84 | HR 69 | Temp 98.7°F | Ht 77.0 in | Wt 343.0 lb

## 2020-10-15 DIAGNOSIS — Z13220 Encounter for screening for lipoid disorders: Secondary | ICD-10-CM | POA: Diagnosis not present

## 2020-10-15 DIAGNOSIS — G43109 Migraine with aura, not intractable, without status migrainosus: Secondary | ICD-10-CM | POA: Diagnosis not present

## 2020-10-15 DIAGNOSIS — I10 Essential (primary) hypertension: Secondary | ICD-10-CM | POA: Diagnosis not present

## 2020-10-15 MED ORDER — ENALAPRIL MALEATE 10 MG PO TABS: 10.0000 mg | ORAL_TABLET | Freq: Every day | ORAL | 1 refills | Status: AC

## 2020-10-15 NOTE — Progress Notes (Signed)
Patient ID: Vincent Solis, male    DOB: December 24, 1995, 25 y.o.   MRN: [id number]   Chief Complaint  Patient presents with   Hypertension   Subjective:    HPI  HTN Pt compliant with BP meds.  No SEs Denies chest pain, sob, LE swelling, or blurry vision.  htn check up. Takes enalapril 43m daily.  Pt just ran out of meds and slight high today. Usually around 120s/80s.  Saw neurologist and put on nortriptyline and helped for h/o atypical migraines. They thought a type of migraine and gave nortriptyline. Back on it since 11/21 and improved only had 1 migraines since then.   Medical History RKeanuhas a past medical history of Hypertension, Low testosterone, Obesity, PONV (postoperative nausea and vomiting), and Seasonal allergies.   Outpatient Encounter Medications as of 10/15/2020  Medication Sig   albuterol (VENTOLIN HFA) 108 (90 Base) MCG/ACT inhaler TAKE 2 PUFFS BY MOUTH 4 TIMES A DAY AS NEEDED FOR WHEEZE   Multiple Vitamin (MULTIVITAMIN WITH MINERALS) TABS tablet Take 1 tablet by mouth daily. Reported on 07/27/2015   nortriptyline (PAMELOR) 25 MG capsule Take 1 capsule (25 mg total) by mouth at bedtime.   [DISCONTINUED] enalapril (VASOTEC) 10 MG tablet Take 1 tablet (10 mg total) by mouth daily. Needs appt for more refills.   enalapril (VASOTEC) 10 MG tablet Take 1 tablet (10 mg total) by mouth daily. Needs appt for more refills.   No facility-administered encounter medications on file as of 10/15/2020.     Review of Systems  Constitutional:  Negative for chills and fever.  HENT:  Negative for congestion, rhinorrhea and sore throat.   Respiratory:  Negative for cough, shortness of breath and wheezing.   Cardiovascular:  Negative for chest pain and leg swelling.  Gastrointestinal:  Negative for abdominal pain, diarrhea, nausea and vomiting.  Genitourinary:  Negative for dysuria and frequency.  Skin:  Negative for rash.  Neurological:  Negative for dizziness, weakness and  headaches.    Vitals BP (!) 142/84   Pulse 69   Temp 98.7 F (37.1 C)   Ht '6\' 5"'  (1.956 m)   Wt (!) 343 lb (155.6 kg)   SpO2 97%   BMI 40.67 kg/m   Objective:   Physical Exam Vitals and nursing note reviewed.  Constitutional:      General: He is not in acute distress.    Appearance: Normal appearance. He is not ill-appearing.  HENT:     Head: Normocephalic.     Nose: Nose normal. No congestion.     Mouth/Throat:     Mouth: Mucous membranes are moist.     Pharynx: No oropharyngeal exudate.  Eyes:     Extraocular Movements: Extraocular movements intact.     Conjunctiva/sclera: Conjunctivae normal.     Pupils: Pupils are equal, round, and reactive to light.  Cardiovascular:     Rate and Rhythm: Normal rate and regular rhythm.     Pulses: Normal pulses.     Heart sounds: Normal heart sounds. No murmur heard. Pulmonary:     Effort: Pulmonary effort is normal.     Breath sounds: Normal breath sounds. No wheezing, rhonchi or rales.  Musculoskeletal:        General: Normal range of motion.     Right lower leg: No edema.     Left lower leg: No edema.  Skin:    General: Skin is warm and dry.     Findings: No rash.  Neurological:  General: No focal deficit present.     Mental Status: He is alert and oriented to person, place, and time.     Cranial Nerves: No cranial nerve deficit.  Psychiatric:        Mood and Affect: Mood normal.        Behavior: Behavior normal.        Thought Content: Thought content normal.        Judgment: Judgment normal.     Assessment and Plan   1. Essential hypertension, benign - CBC - CMP14+EGFR  2. Lipid screening - Lipid panel  3. Migraine with aura and without status migrainosus, not intractable   Bp slight elevated, pt stating he ran out of meds.  Will refill enalapril 3m. Pt to take meds when he gets home. Pt to cont to monitor at home and call if seeing numbers over 140/85 or above.  Dec salt intake and cont to exercise.    Migraines -stable, controlled. Cont f/u with neuro prn. -cont with nortriptyline.  Return in about 6 months (around 04/16/2021), or if symptoms worsen or fail to improve.   11/04/2020

## 2021-03-08 NOTE — Progress Notes (Deleted)
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  Nortriptyline 25mg  at bedtime refilled 2.  Limit use of pain relievers to no more than 2 days out of week to prevent risk of rebound or medication-overuse headache. 3.  Keep headache diary 4.  Follow up 6 months.  Subjective:  Vincent Solis is a 25 year old right-handed male with HTN, anxiety and history of right-sided Bell's palsy who follows up for migraine with aura.   UPDATE: Intensity:  mild Duration:  1 to 2 hours Frequency:  Once a month In March, he experienced right sided facial numbness with mild facial droop.  Went to the ED.  Treated for recurrent Bell's palsy with prednisone and Valtrex.  Not nearly as severe as previous.  Resolved after a couple of days.   Frequency of abortive medication: 1 a month Current NSAIDS/analgesics:  2 Tylenol Current triptans:  contraindicated Current ergotamine:  contraindicated Current anti-emetic:  none Current muscle relaxants:  none Current Antihypertensive medications:  Enalapril 10mg  daily Current Antidepressant medications:  nortriptyline 25mg  QHS Current Anticonvulsant medications:  none Current anti-CGRP:  none Current Vitamins/Herbal/Supplements:  MVI Current Antihistamines/Decongestants:  none Other therapy:  none Hormone/birth control:  None Other medication:  Buspar   Caffeine:  Cut back to just 1 cup of coffee   HISTORY:  He had COVID in December 2020 with flu and bronchitis.  About 6 weeks later, he had right-sided Bell's palsy, which subsequently resolved.  He was admitted to [facility] on 10/05/2019 for right-sided facial numbness/eye twitching and right sided numbness and weakness which progressed over a few days.  He also had right-sided headache with dizziness and feeling off-balance but  no spinning sensation.  MRI of brain/IAC with and without contrast personally reviewed were normal.  He underwent stroke workup.  CTA of head and neck personally reviewed were normal.  2D echocardiogram showed EF 50-55% with no cardiac source of embolus.  Hypercoagulable panel was unremarkable.  Lipid panel showed LDL of 95.  Hgb A1c was 5.1.  HIV and SARS Coronavirus testing negative.  Unclear if episode was related to anxiety.  Symptoms resolved after 2 days. In August 2021, he began experiencing right-sided posterior headaches associated with mild right upper extremity weakness and numbness and tingling of the 4th and 5th digits of his right hand, tightness in his right calf, blurred vision in his right eye, some nausea and photophobia.  Headaches would occur 45 minutes every couple of hours and numbness would occur off and on.  This would occur over 3 to 4 days and recur every 2 days.  No known triggers.  He has used rizatriptan with mild efficacy.    He has no personal history of headaches. His mother had history of migraines.       Past NSAIDS/analgesics:  none Past abortive triptans:  rizatriptan Past abortive ergotamine:  none Past muscle relaxants:  none Past anti-emetic:  none Past antihypertensive medications:  none Past antidepressant medications:  none Past anticonvulsant medications:  none Past anti-CGRP:  Nurtec (rescue) Past vitamins/Herbal/Supplements:  none Past antihistamines/decongestants:  none Other past therapies:  none  PAST MEDICAL HISTORY: Past Medical History:  Diagnosis Date   Hypertension    being observed at present- will re evaluate 6 months after gastric sleeve surgery.   Low testosterone  Obesity    PONV (postoperative nausea and vomiting)    Seasonal allergies     MEDICATIONS: Current Outpatient Medications on File Prior to Visit  Medication Sig Dispense Refill   albuterol (VENTOLIN HFA) 108 (90 Base) MCG/ACT inhaler TAKE 2 PUFFS BY MOUTH 4 TIMES  A DAY AS NEEDED FOR WHEEZE 18 g 2   enalapril (VASOTEC) 10 MG tablet Take 1 tablet (10 mg total) by mouth daily. Needs appt for more refills. 90 tablet 1   Multiple Vitamin (MULTIVITAMIN WITH MINERALS) TABS tablet Take 1 tablet by mouth daily. Reported on 07/27/2015     nortriptyline (PAMELOR) 25 MG capsule Take 1 capsule (25 mg total) by mouth at bedtime. 30 capsule 5   No current facility-administered medications on file prior to visit.    ALLERGIES: Allergies  Allergen Reactions   Sulfa Antibiotics Rash    FAMILY HISTORY: Family History  Problem Relation Age of Onset   Mental illness Mother    Hyperlipidemia Mother    Melanoma Mother    Hyperlipidemia Father    GER disease Father    Malignant hyperthermia Father    Cancer Other    Colitis Other       Objective:  *** General: No acute distress.  Patient appears ***-groomed.   Head:  Normocephalic/atraumatic Eyes:  Fundi examined but not visualized Neck: supple, no paraspinal tenderness, full range of motion Heart:  Regular rate and rhythm Lungs:  Clear to auscultation bilaterally Back: No paraspinal tenderness Neurological Exam: alert and oriented to person, place, and time.  Speech fluent and not dysarthric, language intact.  CN II-XII intact. Bulk and tone normal, muscle strength 5/5 throughout.  Sensation to light touch intact.  Deep tendon reflexes 2+ throughout, toes downgoing.  Finger to nose testing intact.  Gait normal, Romberg negative.   Shon Millet, DO  CC: ***

## 2021-03-09 ENCOUNTER — Other Ambulatory Visit: Payer: Self-pay | Admitting: Neurology

## 2021-03-10 ENCOUNTER — Ambulatory Visit: Payer: BC Managed Care – PPO | Admitting: Neurology

## 2021-03-15 ENCOUNTER — Other Ambulatory Visit: Payer: Self-pay | Admitting: Neurology

## 2021-04-04 NOTE — Progress Notes (Signed)
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  Nortriptyline 25mg  at bedtime refilled 2.  Limit use of pain relievers to no more than 2 days out of week to prevent risk of rebound or medication-overuse headache. 3.  Keep headache diary 4.  Follow up 1 year  Subjective:  Vincent Solis is a 25 year old right-handed male with HTN, anxiety and history of right-sided Bell's palsy who follows up for migraine with aura.   UPDATE: Intensity:  mild Duration:  1 to 2 hours Frequency:  Once a month    Frequency of abortive medication: 1 a month Current NSAIDS/analgesics:  2 Tylenol Current triptans:  contraindicated Current ergotamine:  contraindicated Current anti-emetic:  none Current muscle relaxants:  none Current Antihypertensive medications:  Enalapril 10mg  daily Current Antidepressant medications:  nortriptyline 25mg  QHS Current Anticonvulsant medications:  none Current anti-CGRP:  none Current Vitamins/Herbal/Supplements:  MVI Current Antihistamines/Decongestants:  none Other therapy:  none Hormone/birth control:  None Other medication:  Buspar   Caffeine:  Cut back to just 1 cup of coffee   HISTORY:  Vincent Solis had COVID in December 2020 with flu and bronchitis.  About 6 weeks later, Vincent Solis had right-sided Bell's palsy, which subsequently resolved.  Vincent Solis was admitted to [facility] on 10/05/2019 for right-sided facial numbness/eye twitching and right sided numbness and weakness which progressed over a few days.  Vincent Solis also had right-sided headache with dizziness and feeling off-balance but no spinning sensation.  MRI of brain/IAC with and without contrast personally reviewed were normal.  Vincent Solis underwent stroke workup.  CTA of  head and neck personally reviewed were normal.  2D echocardiogram showed EF 50-55% with no cardiac source of embolus.  Hypercoagulable panel was unremarkable.  Lipid panel showed LDL of 95.  Hgb A1c was 5.1.  HIV and SARS Coronavirus testing negative.  Unclear if episode was related to anxiety.  Symptoms resolved after 2 days. In August 2021, Vincent Solis began experiencing right-sided posterior headaches associated with mild right upper extremity weakness and numbness and tingling of the 4th and 5th digits of his right hand, tightness in his right calf, blurred vision in his right eye, some nausea and photophobia.  Headaches would occur 45 minutes every couple of hours and numbness would occur off and on.  This would occur over 3 to 4 days and recur every 2 days.  No known triggers.  Vincent Solis has used rizatriptan with mild efficacy. In March 2022, Vincent Solis experienced right sided facial numbness with mild facial droop.  Went to the ED.  Treated for recurrent Bell's palsy with prednisone and Valtrex.  Not nearly as severe as previous.  Resolved after a couple of days.   Vincent Solis has no personal history of headaches. His mother had history of migraines.       Past NSAIDS/analgesics:  none Past abortive triptans:  rizatriptan Past abortive ergotamine:  none Past muscle relaxants:  none Past anti-emetic:  none Past antihypertensive medications:  none Past antidepressant medications:  none Past anticonvulsant medications:  none Past anti-CGRP:  Nurtec (rescue) Past vitamins/Herbal/Supplements:  none Past antihistamines/decongestants:  none Other past therapies:  none  PAST MEDICAL HISTORY: Past Medical History:  Diagnosis Date   Hypertension    being  observed at present- will re evaluate 6 months after gastric sleeve surgery.   Low testosterone    Obesity    PONV (postoperative nausea and vomiting)    Seasonal allergies     MEDICATIONS: Current Outpatient Medications on File Prior to Visit  Medication Sig Dispense  Refill   albuterol (VENTOLIN HFA) 108 (90 Base) MCG/ACT inhaler TAKE 2 PUFFS BY MOUTH 4 TIMES A DAY AS NEEDED FOR WHEEZE 18 g 2   enalapril (VASOTEC) 10 MG tablet Take 1 tablet (10 mg total) by mouth daily. Needs appt for more refills. 90 tablet 1   Multiple Vitamin (MULTIVITAMIN WITH MINERALS) TABS tablet Take 1 tablet by mouth daily. Reported on 07/27/2015     nortriptyline (PAMELOR) 25 MG capsule TAKE 1 CAPSULE BY MOUTH AT BEDTIME. 30 capsule 5   No current facility-administered medications on file prior to visit.    ALLERGIES: Allergies  Allergen Reactions   Sulfa Antibiotics Rash    FAMILY HISTORY: Family History  Problem Relation Age of Onset   Mental illness Mother    Hyperlipidemia Mother    Melanoma Mother    Hyperlipidemia Father    GER disease Father    Malignant hyperthermia Father    Cancer Other    Colitis Other       Objective:  Blood pressure (!) 157/101, pulse 68, height 6\' 5"  (1.956 m), weight (!) 347 lb 6.4 oz (157.6 kg), SpO2 97 %. General: No acute distress.  Patient appears well-groomed.    Metta Clines, DO  CC: Elvia Collum, DO

## 2021-04-05 ENCOUNTER — Encounter: Payer: Self-pay | Admitting: Neurology

## 2021-04-05 ENCOUNTER — Ambulatory Visit: Payer: 59 | Admitting: Neurology

## 2021-04-05 ENCOUNTER — Other Ambulatory Visit: Payer: Self-pay

## 2021-04-05 VITALS — BP 157/101 | HR 68 | Ht 77.0 in | Wt 347.4 lb

## 2021-04-05 DIAGNOSIS — G510 Bell's palsy: Secondary | ICD-10-CM | POA: Diagnosis not present

## 2021-04-05 DIAGNOSIS — I10 Essential (primary) hypertension: Secondary | ICD-10-CM | POA: Diagnosis not present

## 2021-04-05 DIAGNOSIS — G43109 Migraine with aura, not intractable, without status migrainosus: Secondary | ICD-10-CM | POA: Diagnosis not present

## 2021-04-05 MED ORDER — NORTRIPTYLINE HCL 25 MG PO CAPS
25.0000 mg | ORAL_CAPSULE | Freq: Every day | ORAL | 3 refills | Status: DC
Start: 2021-04-05 — End: 2022-05-18

## 2021-04-05 NOTE — Patient Instructions (Signed)
Refilled nortriptyline 25mg  at bedtime

## 2021-10-07 ENCOUNTER — Ambulatory Visit: Payer: Self-pay | Admitting: Neurology

## 2022-04-07 NOTE — Progress Notes (Deleted)
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  Nortriptyline 25mg  at bedtime refilled 2.  Limit use of pain relievers to no more than 2 days out of week to prevent risk of rebound or medication-overuse headache. 3.  Keep headache diary 4.  Follow up 1 year   Subjective:  Vincent Solis is a 26 year old right-handed male with HTN, anxiety and history of right-sided Bell's palsy who follows up for migraine with aura.   UPDATE: Intensity:  mild Duration:  1 to 2 hours Frequency:  Once a month     Frequency of abortive medication: 1 a month Current NSAIDS/analgesics:  2 Tylenol Current triptans:  contraindicated Current ergotamine:  contraindicated Current anti-emetic:  none Current muscle relaxants:  none Current Antihypertensive medications:  Enalapril 10mg  daily Current Antidepressant medications:  nortriptyline 25mg  QHS Current Anticonvulsant medications:  none Current anti-CGRP:  none Current Vitamins/Herbal/Supplements:  MVI Current Antihistamines/Decongestants:  none Other therapy:  none Hormone/birth control:  None Other medication:  Buspar   Caffeine:  Cut back to just 1 cup of coffee   HISTORY:  He had COVID in December 2020 with flu and bronchitis.  About 6 weeks later, he had right-sided Bell's palsy, which subsequently resolved.  He was admitted to [facility] on 10/05/2019 for right-sided facial numbness/eye twitching and right sided numbness and weakness which progressed over a few days.  He also had right-sided headache with dizziness and feeling off-balance but no spinning sensation.  MRI of brain/IAC with and without contrast personally reviewed were normal.  He underwent stroke workup.  CTA of head and neck personally reviewed were normal.  2D echocardiogram showed  EF 50-55% with no cardiac source of embolus.  Hypercoagulable panel was unremarkable.  Lipid panel showed LDL of 95.  Hgb A1c was 5.1.  HIV and SARS Coronavirus testing negative.  Unclear if episode was related to anxiety.  Symptoms resolved after 2 days. In August 2021, he began experiencing right-sided posterior headaches associated with mild right upper extremity weakness and numbness and tingling of the 4th and 5th digits of his right hand, tightness in his right calf, blurred vision in his right eye, some nausea and photophobia.  Headaches would occur 45 minutes every couple of hours and numbness would occur off and on.  This would occur over 3 to 4 days and recur every 2 days.  No known triggers.  He has used rizatriptan with mild efficacy. In March 2022, he experienced right sided facial numbness with mild facial droop.  Went to the ED.  Treated for recurrent Bell's palsy with prednisone and Valtrex.  Not nearly as severe as previous.  Resolved after a couple of days.   He has no personal history of headaches. His mother had history of migraines.       Past NSAIDS/analgesics:  none Past abortive triptans:  rizatriptan Past abortive ergotamine:  none Past muscle relaxants:  none Past anti-emetic:  none Past antihypertensive medications:  none Past antidepressant medications:  none Past anticonvulsant medications:  none Past anti-CGRP:  Nurtec (rescue) Past vitamins/Herbal/Supplements:  none Past antihistamines/decongestants:  none Other past therapies:  none  PAST MEDICAL HISTORY: Past Medical History:  Diagnosis Date   Hypertension    being observed at present- will re evaluate 6 months after gastric sleeve  surgery.   Low testosterone    Obesity    PONV (postoperative nausea and vomiting)    Seasonal allergies     MEDICATIONS: Current Outpatient Medications on File Prior to Visit  Medication Sig Dispense Refill   albuterol (VENTOLIN HFA) 108 (90 Base) MCG/ACT inhaler TAKE 2  PUFFS BY MOUTH 4 TIMES A DAY AS NEEDED FOR WHEEZE 18 g 2   enalapril (VASOTEC) 10 MG tablet Take 1 tablet (10 mg total) by mouth daily. Needs appt for more refills. 90 tablet 1   Multiple Vitamin (MULTIVITAMIN WITH MINERALS) TABS tablet Take 1 tablet by mouth daily. Reported on 07/27/2015     nortriptyline (PAMELOR) 25 MG capsule Take 1 capsule (25 mg total) by mouth at bedtime. 90 capsule 3   No current facility-administered medications on file prior to visit.    ALLERGIES: Allergies  Allergen Reactions   Sulfa Antibiotics Rash    FAMILY HISTORY: Family History  Problem Relation Age of Onset   Mental illness Mother    Hyperlipidemia Mother    Melanoma Mother    Hyperlipidemia Father    GER disease Father    Malignant hyperthermia Father    Cancer Other    Colitis Other       Objective:  *** General: No acute distress.  Patient appears well-groomed.   Head:  Normocephalic/atraumatic Eyes:  Fundi examined but not visualized Neck: supple, no paraspinal tenderness, full range of motion Heart:  Regular rate and rhythm Neurological Exam: alert and oriented to person, place, and time.  Speech fluent and not dysarthric, language intact.  CN II-XII intact. Bulk and tone normal, muscle strength 5/5 throughout.  Sensation to light touch intact.  Deep tendon reflexes 2+ throughout, toes downgoing.  Finger to nose testing intact.  Gait normal, Romberg negative.   Vincent Clines, DO  CC: Vincent Pink, MD

## 2022-04-11 ENCOUNTER — Ambulatory Visit: Payer: Self-pay | Admitting: Neurology

## 2022-04-11 ENCOUNTER — Encounter: Payer: Self-pay | Admitting: Neurology

## 2022-04-11 DIAGNOSIS — Z029 Encounter for administrative examinations, unspecified: Secondary | ICD-10-CM

## 2022-05-01 ENCOUNTER — Telehealth: Payer: Self-pay | Admitting: Neurology

## 2022-05-18 ENCOUNTER — Other Ambulatory Visit: Payer: Self-pay | Admitting: Neurology

## 2022-05-18 MED ORDER — NORTRIPTYLINE HCL 25 MG PO CAPS: 25.0000 mg | ORAL_CAPSULE | Freq: Every day | ORAL | 0 refills | Status: DC

## 2022-05-18 NOTE — Addendum Note (Signed)
Addended by: Venetia Night on: 05/18/2022 02:09 PM   Modules accepted: Orders

## 2022-05-18 NOTE — Telephone Encounter (Signed)
Patient called in, has an appointment on the 30th of this month and is wondering if we can send in nortriptyline (PAMELOR) 25 MG capsule to last until the appointment.

## 2022-05-18 NOTE — Telephone Encounter (Signed)
Per Dr.Jaffe okay to send a refill until her next appt.

## 2022-06-06 NOTE — Progress Notes (Deleted)
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  Nortriptyline '25mg'$  at bedtime refilled 2.  Limit use of pain relievers to no more than 2 days out of week to prevent risk of rebound or medication-overuse headache. 3.  Keep headache diary 4.  Follow up 1 year ***   Subjective:  Vincent Solis is a 27 year old right-handed male with HTN, anxiety and history of right-sided Bell's palsy who follows up for migraine with aura.   UPDATE: Intensity:  mild Duration:  1 to 2 hours Frequency:  Once a month     Frequency of abortive medication: 1 a month Current NSAIDS/analgesics:  2 Tylenol Current triptans:  contraindicated Current ergotamine:  contraindicated Current anti-emetic:  none Current muscle relaxants:  none Current Antihypertensive medications:  Enalapril '10mg'$  daily Current Antidepressant medications:  nortriptyline '25mg'$  QHS Current Anticonvulsant medications:  none Current anti-CGRP:  none Current Vitamins/Herbal/Supplements:  MVI Current Antihistamines/Decongestants:  none Other therapy:  none Hormone/birth control:  None Other medication:  Buspar   Caffeine:  Cut back to just 1 cup of coffee   HISTORY:  He had COVID in December 2020 with flu and bronchitis.  About 6 weeks later, he had right-sided Bell's palsy, which subsequently resolved.  He was admitted to [facility] on 10/05/2019 for right-sided facial numbness/eye twitching and right sided numbness and weakness which progressed over a few days.  He also had right-sided headache with dizziness and feeling off-balance but no spinning sensation.  MRI of brain/IAC with and without contrast personally reviewed were normal.  He underwent stroke workup.  CTA of head and neck personally reviewed were normal.  2D echocardiogram showed EF 50-55%  with no cardiac source of embolus.  Hypercoagulable panel was unremarkable.  Lipid panel showed LDL of 95.  Hgb A1c was 5.1.  HIV and SARS Coronavirus testing negative.  Unclear if episode was related to anxiety.  Symptoms resolved after 2 days. In August 2021, he began experiencing right-sided posterior headaches associated with mild right upper extremity weakness and numbness and tingling of the 4th and 5th digits of his right hand, tightness in his right calf, blurred vision in his right eye, some nausea and photophobia.  Headaches would occur 45 minutes every couple of hours and numbness would occur off and on.  This would occur over 3 to 4 days and recur every 2 days.  No known triggers.  He has used rizatriptan with mild efficacy. In March 2022, he experienced right sided facial numbness with mild facial droop.  Went to the ED.  Treated for recurrent Bell's palsy with prednisone and Valtrex.  Not nearly as severe as previous.  Resolved after a couple of days.   He has no personal history of headaches. His mother had history of migraines.       Past NSAIDS/analgesics:  none Past abortive triptans:  rizatriptan Past abortive ergotamine:  none Past muscle relaxants:  none Past anti-emetic:  none Past antihypertensive medications:  none Past antidepressant medications:  none Past anticonvulsant medications:  none Past anti-CGRP:  Nurtec (rescue) Past vitamins/Herbal/Supplements:  none Past antihistamines/decongestants:  none Other past therapies:  none  PAST MEDICAL HISTORY: Past Medical History:  Diagnosis Date   Hypertension    being observed at present- will re evaluate 6 months after gastric sleeve surgery.  Low testosterone    Obesity    PONV (postoperative nausea and vomiting)    Seasonal allergies     MEDICATIONS: Current Outpatient Medications on File Prior to Visit  Medication Sig Dispense Refill   albuterol (VENTOLIN HFA) 108 (90 Base) MCG/ACT inhaler TAKE 2 PUFFS BY  MOUTH 4 TIMES A DAY AS NEEDED FOR WHEEZE 18 g 2   enalapril (VASOTEC) 10 MG tablet Take 1 tablet (10 mg total) by mouth daily. Needs appt for more refills. 90 tablet 1   Multiple Vitamin (MULTIVITAMIN WITH MINERALS) TABS tablet Take 1 tablet by mouth daily. Reported on 07/27/2015     nortriptyline (PAMELOR) 25 MG capsule Take 1 capsule (25 mg total) by mouth at bedtime. 30 capsule 0   No current facility-administered medications on file prior to visit.    ALLERGIES: Allergies  Allergen Reactions   Sulfa Antibiotics Rash    FAMILY HISTORY: Family History  Problem Relation Age of Onset   Mental illness Mother    Hyperlipidemia Mother    Melanoma Mother    Hyperlipidemia Father    GER disease Father    Malignant hyperthermia Father    Cancer Other    Colitis Other       Objective:  *** General: No acute distress.  Patient appears ***-groomed.   Head:  Normocephalic/atraumatic Eyes:  Fundi examined but not visualized Neck: supple, no paraspinal tenderness, full range of motion Heart:  Regular rate and rhythm Lungs:  Clear to auscultation bilaterally Back: No paraspinal tenderness Neurological Exam: alert and oriented to person, place, and time.  Speech fluent and not dysarthric, language intact.  CN II-XII intact. Bulk and tone normal, muscle strength 5/5 throughout.  Sensation to light touch intact.  Deep tendon reflexes 2+ throughout, toes downgoing.  Finger to nose testing intact.  Gait normal, Romberg negative.   Vincent Clines, DO  CC: Vincent Pink, MD

## 2022-06-07 ENCOUNTER — Ambulatory Visit: Payer: Self-pay | Admitting: Neurology

## 2022-06-07 ENCOUNTER — Encounter: Payer: Self-pay | Admitting: Neurology

## 2022-06-07 DIAGNOSIS — Z029 Encounter for administrative examinations, unspecified: Secondary | ICD-10-CM

## 2022-06-08 ENCOUNTER — Encounter: Payer: Self-pay | Admitting: Neurology

## 2022-06-09 ENCOUNTER — Other Ambulatory Visit: Payer: Self-pay | Admitting: Neurology

## 2022-06-15 ENCOUNTER — Telehealth: Payer: Self-pay | Admitting: Neurology

## 2022-06-15 NOTE — Telephone Encounter (Signed)
Patient dismissed from [facility] and all providers practicing at this clinic per letter 06/08/22

## 2022-11-24 DIAGNOSIS — R102 Pelvic and perineal pain: Secondary | ICD-10-CM | POA: Diagnosis not present

## 2022-11-24 DIAGNOSIS — R300 Dysuria: Secondary | ICD-10-CM | POA: Diagnosis not present

## 2022-11-24 DIAGNOSIS — Z125 Encounter for screening for malignant neoplasm of prostate: Secondary | ICD-10-CM | POA: Diagnosis not present

## 2022-11-24 DIAGNOSIS — I10 Essential (primary) hypertension: Secondary | ICD-10-CM | POA: Diagnosis not present

## 2022-12-21 ENCOUNTER — Other Ambulatory Visit: Payer: Self-pay | Admitting: Neurology

## 2023-06-06 ENCOUNTER — Other Ambulatory Visit: Payer: Self-pay

## 2023-06-06 ENCOUNTER — Emergency Department
Admission: EM | Admit: 2023-06-06 | Discharge: 2023-06-06 | Disposition: A | Discharge status: Home / Self Care | Payer: BC Managed Care – PPO | Attending: Student in an Organized Health Care Education/Training Program | Admitting: Student in an Organized Health Care Education/Training Program

## 2023-06-06 ENCOUNTER — Emergency Department: Payer: BC Managed Care – PPO

## 2023-06-06 DIAGNOSIS — I10 Essential (primary) hypertension: Secondary | ICD-10-CM | POA: Insufficient documentation

## 2023-06-06 DIAGNOSIS — R0789 Other chest pain: Secondary | ICD-10-CM | POA: Diagnosis present

## 2023-06-06 LAB — BASIC METABOLIC PANEL
Anion gap: 16 — ABNORMAL HIGH (ref 5–15)
BUN: 9 mg/dL (ref 6–20)
CO2: 23 mmol/L (ref 22–32)
Calcium: 9.3 mg/dL (ref 8.9–10.3)
Chloride: 97 mmol/L — ABNORMAL LOW (ref 98–111)
Creatinine, Ser: 0.68 mg/dL (ref 0.61–1.24)
GFR, Estimated: >60 mL/min (ref >60)
Glucose, Bld: 99 mg/dL (ref 70–99)
Potassium: 4.1 mmol/L (ref 3.5–5.1)
Sodium: 136 mmol/L (ref 135–145)

## 2023-06-06 LAB — CBC
HCT: 52.9 % — ABNORMAL HIGH (ref 39.0–52.0)
Hemoglobin: 18.4 g/dL — ABNORMAL HIGH (ref 13.0–17.0)
MCH: 30 pg (ref 26.0–34.0)
MCHC: 34.8 g/dL (ref 30.0–36.0)
MCV: 86.2 fL (ref 80.0–100.0)
Platelets: 315 10*3/uL (ref 150–400)
RBC: 6.14 MIL/uL — ABNORMAL HIGH (ref 4.22–5.81)
RDW: 12.5 % (ref 11.5–15.5)
WBC: 14.7 10*3/uL — ABNORMAL HIGH (ref 4.0–10.5)
nRBC: 0 % (ref 0.0–0.2)

## 2023-06-06 LAB — TROPONIN I (HIGH SENSITIVITY): Troponin I (High Sensitivity): 16 ng/L (ref <18)

## 2023-06-06 LAB — HEPATIC FUNCTION PANEL
ALT: 99 U/L — ABNORMAL HIGH (ref 0–44)
AST: 74 U/L — ABNORMAL HIGH (ref 15–41)
Albumin: 4.8 g/dL (ref 3.5–5.0)
Alkaline Phosphatase: 68 U/L (ref 38–126)
Bilirubin, Direct: 0.2 mg/dL (ref 0.0–0.2)
Indirect Bilirubin: 1.4 mg/dL — ABNORMAL HIGH (ref 0.3–0.9)
Total Bilirubin: 1.6 mg/dL — ABNORMAL HIGH (ref 0.0–1.2)
Total Protein: 8 g/dL (ref 6.5–8.1)

## 2023-06-06 LAB — LIPASE, BLOOD: Lipase: 29 U/L (ref 11–51)

## 2023-06-06 MED ORDER — DIAZEPAM 5 MG PO TABS
5.0000 mg | ORAL_TABLET | Freq: Once | ORAL | Status: AC
  Administered 2023-06-06: 5 mg via ORAL
  Filled 2023-06-06: qty 1

## 2023-06-06 NOTE — ED Provider Notes (Signed)
[facility] Provider Note    Event Date/Time   First MD Initiated Contact with Patient 06/06/23 1947     (approximate)   History   Chest Pain   HPI  Vincent Solis is a 28 y.o. male history of hypertension presents to the ER for evaluation of chest pain and pressure.  Symptoms started this morning.  States he occasionally has a palpitations.  States he will sometimes get the symptoms after excess alcohol ingestion.  States that he has been drinking excessively over the past 2 3 days.  Does have some pain going up and to the back.  Denies any shortness of breath.  Does admit to history of anxiety and stress.  He denies any lower abdominal pain.  No nausea or vomiting no diarrhea.  No fevers or chills.     Physical Exam   Triage Vital Signs: ED Triage Vitals  Encounter Vitals Group     BP 06/06/23 1347 (!) 158/108     Systolic BP Percentile --      Diastolic BP Percentile --      Pulse Rate 06/06/23 1347 83     Resp 06/06/23 1347 19     Temp 06/06/23 1347 97.9 F (36.6 C)     Temp Source 06/06/23 1347 Oral     SpO2 06/06/23 1347 100 %     Weight 06/06/23 1345 (!) 320 lb (145.2 kg)     Height 06/06/23 1345 6\' 5"  (1.956 m)     Head Circumference --      Peak Flow --      Pain Score 06/06/23 1345 8     Pain Loc --      Pain Education --      Exclude from Growth Chart --     Most recent vital signs: Vitals:   06/06/23 1347 06/06/23 1833  BP: (!) 158/108 (!) 156/112  Pulse: 83 61  Resp: 19 18  Temp: 97.9 F (36.6 C) 98.2 F (36.8 C)  SpO2: 100% 98%     Constitutional: Alert  Eyes: Conjunctivae are normal.  Head: Atraumatic. Nose: No congestion/rhinnorhea. Mouth/Throat: Mucous membranes are moist.   Neck: Painless ROM.  Cardiovascular:   Good peripheral circulation. Respiratory: Normal respiratory effort.  No retractions.  Gastrointestinal: Soft and nontender.  Musculoskeletal:  no deformity Neurologic:  MAE spontaneously. No gross  focal neurologic deficits are appreciated.  Skin:  Skin is warm, dry and intact. No rash noted. Psychiatric: Mood and affect are normal. Speech and behavior are normal.    ED Results / Procedures / Treatments   Labs (all labs ordered are listed, but only abnormal results are displayed) Labs Reviewed  BASIC METABOLIC PANEL - Abnormal; Notable for the following components:      Result Value   Chloride 97 (*)    Anion gap 16 (*)    All other components within normal limits  CBC - Abnormal; Notable for the following components:   WBC 14.7 (*)    RBC 6.14 (*)    Hemoglobin 18.4 (*)    HCT 52.9 (*)    All other components within normal limits  HEPATIC FUNCTION PANEL - Abnormal; Notable for the following components:   AST 74 (*)    ALT 99 (*)    Total Bilirubin 1.6 (*)    Indirect Bilirubin 1.4 (*)    All other components within normal limits  LIPASE, BLOOD  TROPONIN I (HIGH SENSITIVITY)     EKG  ED ECG REPORT I, Willy Eddy, the attending physician, personally viewed and interpreted this ECG.   Date: 06/06/2023  EKG Time: 13:43  Rate: 80  Rhythm: sinus  Axis: left  Intervals: normal  ST&T Change: no stemi, no depressions, poor r wave progression    RADIOLOGY Please see ED Course for my review and interpretation.  I personally reviewed all radiographic images ordered to evaluate for the above acute complaints and reviewed radiology reports and findings.  These findings were personally discussed with the patient.  Please see medical record for radiology report.    PROCEDURES:  Critical Care performed: No  Procedures   MEDICATIONS ORDERED IN ED: Medications  diazepam (VALIUM) tablet 5 mg (5 mg Oral Given 06/06/23 2009)     IMPRESSION / MDM / ASSESSMENT AND PLAN / ED COURSE  I reviewed the triage vital signs and the nursing notes.                              Differential diagnosis includes, but is not limited to, ACS, pericarditis, esophagitis,  boerhaaves, pe, dissection, pna, bronchitis, costochondritis  Patient presenting to the ER for evaluation of symptoms as described above.  Based on symptoms, risk factors and considered above differential, this presenting complaint could reflect a potentially life-threatening illness therefore the patient will be placed on continuous pulse oximetry and telemetry for monitoring.  Laboratory evaluation will be sent to evaluate for the above complaints.  Patient is well-appearing, somewhat anxious mildly hypertensive.  Chest x-ray on my review and interpretation without evidence of CHF or pneumothorax.  Troponin negative after 12 hours of discomfort.  Not consistent with ACS.  Is lowers by Wells criteria is PERC negative.  Possible component of stress.  Patient does endorse significant alcohol ingestion recently therefore we will check lipase as well as LFTs.  Does not feel like this is heartburn.   Clinical Course as of 06/06/23 2046  Tue Jun 06, 2023  2008 Patient reassessed.  States he is feeling quite a bit anxious would like to try some for anxiety.  States that he can get a ride home.  Will order p.o. Valium. [PR]  2043 Patient with mild improvement in symptoms after Valium but still having some feelings of anxiety.  Patient's LFTs are back with mild elevation.  Lipase is normal.  No epigastric pain.  No fevers or chills.  Patient notes that he thinks a large component of this is likely related to alcohol ingestion.  We had lengthy discussion regarding avoidance as well as signs and symptoms for which he should return to the ER.  The patient is well-appearing he is reliable and does appear stable and appropriate for outpatient follow-up. [PR]    Clinical Course User Index [PR] Willy Eddy, MD     FINAL CLINICAL IMPRESSION(S) / ED DIAGNOSES   Final diagnoses:  Atypical chest pain     Rx / DC Orders   ED Discharge Orders     None        Note:  This document was prepared using  Dragon voice recognition software and may include unintentional dictation errors.    Willy Eddy, MD 06/06/23 2046

## 2023-06-06 NOTE — ED Notes (Signed)
Pt contact made and myself introduced. Pt is CAOx4 and breathing normally. Same is relaxing at this time and does not appear to be in any distress.

## 2023-06-06 NOTE — ED Provider Triage Note (Signed)
Emergency Medicine Provider Triage Evaluation Note  Vincent Solis , a 28 y.o. male  was evaluated in triage.  Pt complains of chest pain that started this morning. History of HTN and did take medication this am.  .  Review of Systems  Positive: + CP, + anxiety, + HTN Negative: Vomiting, fever  Physical Exam  BP (!) 158/108 (BP Location: Left Arm)   Pulse 83   Temp 97.9 F (36.6 C) (Oral)   Resp 19   Ht 6\' 5"  (1.956 m)   Wt (!) 145.2 kg   SpO2 100%   BMI 37.95 kg/m  Gen:   Awake, no distress   Ambulatory without difficulty Resp:  Normal effort  MSK:   Moves extremities without difficulty  Other:    Medical Decision Making  Medically screening exam initiated at 1:49 PM.  Appropriate orders placed.  Vincent Solis was informed that the remainder of the evaluation will be completed by another provider, this initial triage assessment does not replace that evaluation, and the importance of remaining in the ED until their evaluation is complete.     Tommi Rumps, PA-C 06/06/23 1351

## 2023-06-06 NOTE — ED Triage Notes (Signed)
Pt sts that he has been having chest pain since this AM. Pt sts that he has seen a cardiologist in the past however they thought it was more anxiety related.
